# Patient Record
Sex: Male | Born: 1935 | Race: White | Hispanic: No | Marital: Married
Health system: Midwestern US, Community
[De-identification: ages and names within clinical notes are randomized; demographics above are authoritative.]

## PROBLEM LIST (undated history)

## (undated) DIAGNOSIS — Z950 Presence of cardiac pacemaker: Secondary | ICD-10-CM

## (undated) DIAGNOSIS — E785 Hyperlipidemia, unspecified: Secondary | ICD-10-CM

## (undated) DIAGNOSIS — I4891 Unspecified atrial fibrillation: Secondary | ICD-10-CM

## (undated) DIAGNOSIS — I480 Paroxysmal atrial fibrillation: Principal | ICD-10-CM

## (undated) DIAGNOSIS — I498 Other specified cardiac arrhythmias: Secondary | ICD-10-CM

## (undated) DIAGNOSIS — I10 Essential (primary) hypertension: Secondary | ICD-10-CM

## (undated) DIAGNOSIS — I2510 Atherosclerotic heart disease of native coronary artery without angina pectoris: Secondary | ICD-10-CM

## (undated) DIAGNOSIS — E782 Mixed hyperlipidemia: Secondary | ICD-10-CM

## (undated) DIAGNOSIS — I499 Cardiac arrhythmia, unspecified: Secondary | ICD-10-CM

## (undated) DIAGNOSIS — E119 Type 2 diabetes mellitus without complications: Secondary | ICD-10-CM

## (undated) DIAGNOSIS — N289 Disorder of kidney and ureter, unspecified: Secondary | ICD-10-CM

## (undated) DIAGNOSIS — F0390 Unspecified dementia without behavioral disturbance: Secondary | ICD-10-CM

## (undated) DIAGNOSIS — E079 Disorder of thyroid, unspecified: Secondary | ICD-10-CM

## (undated) HISTORY — PX: CATARACT EXTRACTION, BILATERAL: SHX1313

## (undated) HISTORY — PX: PACEMAKER IMPLANT: EP1218

## (undated) HISTORY — PX: OTHER SURGICAL HISTORY: SHX169

---

## 2009-10-13 ENCOUNTER — Telehealth

## 2009-10-13 NOTE — Telephone Encounter (Addendum)
Needs norpace rx lone [facility] pharmacy

## 2009-10-13 NOTE — Telephone Encounter (Signed)
Pt was last seen in the office 01/06/09 By Dr.Lineberry. He has a follow up 01/05/10. Mark Franklin

## 2010-01-05 NOTE — Progress Notes (Signed)
[facility] [place], [place]  [address], [place]  [address]  Phone: [phone]  Fax: [phone]    OFFICE VISIT:  01/05/2010    Mark Franklin - DOB: November 27, 1928    Reason For Visit:  Sergei is a 76 y.o. male who is here for Annual Exam    Subjective  Chancy has no exertional chest pain, pressure, burning or squeezing. He is having no complaints. He sleeps on his right side.  No orthopnea or paroxysmal nocturnal dyspnea.  No symptomatic tachy- or brady-arrhythmia.  No numbness or weakness to suggest cerebrovascular accident or transient ischemic attack.    Harle Battiest has the following history as recorded in EpicCare:    Patient Active Problem List   Diagnoses Date Noted   . CAD (coronary artery disease)    . Hyperlipidemia    . HYPERTENSION    . Diabetes mellitus    . PUD (peptic ulcer disease)    . Polyarticular arthritis      Past Medical History   Diagnosis Date   . CAD (coronary artery disease)    . Hyperlipidemia    . HYPERTENSION    . Diabetes mellitus    . PUD (peptic ulcer disease)    . Polyarticular arthritis      Past Surgical History   Procedure Date   . Cardiac pacemaker placement    . Cataract removal    . Carpal tunnel release    . Finger trigger release    . Cardiac catheterization 528413     Ef 60%   . Cardiac catheterization 020801   . Cardiac catheterization 0801   . Cardiac catheterization 244010     EF 50%     Family History   Problem Relation Age of Onset   . Coronary Art Dis Other      family history     History   Substance Use Topics   . Smoking status: Former Games developer   . Smokeless tobacco: Not on file   . Alcohol Use: Not on file      Current Outpatient Prescriptions   Medication Sig Dispense Refill   . aspirin 325 MG tablet Take 325 mg by mouth daily.         Marland Kitchen atorvastatin (LIPITOR) 80 MG tablet Take 80 mg by mouth daily.         Marland Kitchen amlodipine (NORVASC) 10 MG tablet Take 10 mg by mouth daily.         . valsartan-hydrochlorothiazide (DIOVAN HCT) 160-25 MG per  tablet Take 1 tablet by mouth daily.         . metformin (GLUCOPHAGE) 1000 MG tablet Take 1,000 mg by mouth 2 times daily (with meals).         Marland Kitchen omeprazole (PRILOSEC) 20 MG capsule Take 20 mg by mouth 2 times daily.         . disopyramide (NORPACE CR) 150 MG CR capsule Take 1 capsule by mouth 2 times daily.  60 capsule  3     Allergies: Review of patient's allergies indicates no known allergies.    Objective  Vital Signs - BP 110/60  Pulse 60  Ht 5\' 8"  (1.727 m)  Wt 178 lb (80.74 kg)  BMI 27.06 kg/m2  General - Nace is alert, cooperative, and pleasant.  No acute distress.      HEENT - The head is normocephalic.  Neck - Supple, without increased jugular venous pressures.  No  carotid bruits.    Lungs - Lungs are clear bilaterally.  No wheezes or rales.    Cardiovascular - Heart has regular rate and rhythm.  No murmurs, rubs or gallops.      Abdominal -  Soft, nontender, nondistended.  Bowel sounds are intact.   Extremities - No clubbing, cyanosis, or edema.   Musculoskeletal - No musculoskeletal symptoms.     Neurological - No focal signs are identified. Cranial nerves II through XII are grossly intact.    Assessment:   Stable cardiovascular status.     Plan  1. Pacemaker check today.    2. Return for wellness visit in six months and return to see me in a year.  3. Continue same medications.  4. Call with any problems, questions or concerns.    Judith Blonder. Chase Picket, M.D., Ph.D., F.A.C.C.

## 2010-05-31 LAB — APTT: PTT: 25.7 s (ref 21.2–36.5)

## 2010-05-31 LAB — BASIC METABOLIC PANEL
Anion Gap: 9 MEQ/L (ref 7–19)
BUN: 20 MG/DL (ref 6–20)
CO2: 30 MEQ/L — ABNORMAL HIGH (ref 23–29)
Calcium: 10.8 MG/DL — ABNORMAL HIGH (ref 8.5–10.3)
Chloride: 101 MEQ/L (ref 98–111)
Creatinine: 1.1 MG/DL (ref 0.3–1.3)
Gfr Calculated: 69 mL/min (ref >59)
Glucose: 107 MG/DL
Potassium, Fluid: 4.5 MEQ/L (ref 3.6–5.0)
Sodium: 140 MEQ/L (ref 136–145)

## 2010-05-31 LAB — URINALYSIS
Bilirubin, Urine: NEGATIVE
Glucose, Ur: NEGATIVE MG/DL
Ketones, Urine: NEGATIVE MG/DL
Leukocyte Esterase, Urine: NEGATIVE
Nitrite, Urine: NEGATIVE
Occult Blood,Urine: NEGATIVE
Protein, UA: NEGATIVE MG/DL
Specific Gravity, Urine: 1.02 (ref 1.001–1.035)
Urobilinogen, Urine: 0.2 EU/DL
pH, UA: 6

## 2010-05-31 LAB — CBC WITH AUTO DIFFERENTIAL
Basophils %: 0.6 % (ref 0–1)
Basophils: 0.05 10*3/uL (ref 0.0–0.20)
Eosinophils %: 0.53 10*3/uL (ref 0.0–0.60)
Eosinophils %: 5.8 % — ABNORMAL HIGH (ref 1–5)
Hematocrit: 43.8 % (ref 42–52)
Hemoglobin: 14.4 G/DL (ref 14.0–18.0)
Lymphocytes: 1.7 10*3/uL (ref 1.1–4.5)
Lymphocytes: 18.7 % — ABNORMAL LOW (ref 20–40)
MCH: 30.8 PG (ref 27–31)
MCHC: 32.9 G/DL — ABNORMAL LOW (ref 33–37)
MCV: 93.6 FL (ref 80–94)
MPV: 9.6 FL — ABNORMAL LOW (ref 10.2–13.2)
Monocytes %: 10.1 % — ABNORMAL HIGH (ref 0–10)
Monocytes: 0.92 10*3/uL — ABNORMAL HIGH (ref 0.0–0.9)
Neutrophils %: 64.8 % (ref 50–70)
Neutrophils Absolute: 5.89 10*3/uL (ref 1.5–7.5)
Platelet Count: 254 (ref 130–400)
RBC: 4.68 MIL/uL — ABNORMAL LOW (ref 4.7–6.1)
RDW: 14 % (ref 11.0–14.0)
WBC: 9.09 10*3/uL (ref 4.8–10.8)

## 2010-05-31 LAB — PROTIME-INR
INR: 0.98 (ref 0.90–1.10)
Prothrombin Time: 10.6 s (ref 9.7–12.5)

## 2010-06-01 LAB — CULTURE, MRSA, SCREENING: MRSA Culture/Susceptibility: NOT DETECTED

## 2010-06-07 ENCOUNTER — Telehealth

## 2010-06-07 NOTE — Telephone Encounter (Signed)
Needs or clearance w/ dr. Allena Katz lexi scheduled 06/14/10 at 730 pt notified

## 2010-06-08 NOTE — Progress Notes (Signed)
[facility], [place]  [address], [place]  [address]  Phone: [phone]  Fax: [phone]    OFFICE VISIT:  06/08/2010    Mark Franklin - DOB: Sep 12, 1928    Reason For Visit:  Mark Franklin is a 77 y.o. male who is here for Cardiac Clearance and Coronary Artery Disease      Subjective  Mark Franklin denies exertional chest pain, shortness of breath, orthopnea, paroxysmal nocturnal dyspnea, syncope, presyncope, arrythmia, edema and fatigue.  The patient denies numbness or weakness to suggest cerebrovascular accident or transient ischemic attack.      Mark Franklin has the following history as recorded in EpicCare:    Patient Active Problem List   Diagnoses Date Noted   . Pacemaker    . CAD (coronary artery disease)    . Hyperlipidemia    . HYPERTENSION    . Diabetes mellitus    . PUD (peptic ulcer disease)    . Polyarticular arthritis      Past Medical History   Diagnosis Date   . CAD (coronary artery disease)    . Hyperlipidemia    . Hypertension    . Diabetes mellitus    . PUD (peptic ulcer disease)    . Polyarticular arthritis    . Pacemaker 1/05     Medtronic Enpulse     Past Surgical History   Procedure Date   . Cardiac pacemaker placement    . Cataract removal    . Carpal tunnel release    . Finger trigger release    . Cardiac catheterization 528413     Ef 60%   . Cardiac catheterization 020801   . Cardiac catheterization 0801   . Cardiac catheterization 244010     EF 50%     Family History   Problem Relation Age of Onset   . Coronary Art Dis Other      family history     History   Substance Use Topics   . Smoking status: Former Games developer   . Smokeless tobacco: Not on file   . Alcohol Use: Not on file      Current Outpatient Prescriptions   Medication Sig Dispense Refill   . aspirin 325 MG tablet Take 325 mg by mouth daily.         Marland Kitchen atorvastatin (LIPITOR) 80 MG tablet Take 40 mg by mouth daily.       Marland Kitchen amlodipine (NORVASC) 10 MG tablet Take 10 mg by mouth daily.         .  valsartan-hydrochlorothiazide (DIOVAN HCT) 160-25 MG per tablet Take 1 tablet by mouth daily.         . metformin (GLUCOPHAGE) 1000 MG tablet Take 1,000 mg by mouth 2 times daily (with meals).         Marland Kitchen omeprazole (PRILOSEC) 20 MG capsule Take 20 mg by mouth 2 times daily.         . disopyramide (NORPACE CR) 150 MG CR capsule Take 1 capsule by mouth 2 times daily.  60 capsule  3     Allergies: Review of patient's allergies indicates no known allergies.    Objective  Vital Signs - BP 140/62  Pulse 100  Ht 5\' 8"  (1.727 m)  Wt 178 lb (80.74 kg)  BMI 27.06 kg/m2  General - Mark Franklin is alert, cooperative, and pleasant.  No acute distress.      HEENT - The head is normocephalic.  Neck -  Supple, without increased jugular venous pressures.  No carotid bruits.    Lungs - Lungs are clear bilaterally.  No wheezes or rales.    Cardiovascular - Heart has regular rhythm and rate. No murmurs, rubs or gallops.      Abdominal -  Soft, nontender, nondistended.  Bowel sounds are intact.   Extremities - No clubbing, cyanosis, or edema.   Musculoskeletal - No musculoskeletal symptoms.     Neurological - No focal signs are identified. Cranial nerves II through XII are grossly intact.    Assessment:    1. CAD (coronary artery disease)    2. Hyperlipidemia    3. Hypertension    4. Polyarticular arthritis    5. Pacemaker    Lexiscan already ordered and set up for next week for surgical clearance.   Pacemaker check showed appropriate diagnostics without any sustained arrhythmias.   Stable cardiovascular status. No evidence of overt heart failure, angina or dysrhythmia.     Plan  1. Lexiscan next week  2. Return to see Dr. Chase Picket in 6  Months as scheduled.   3. Continue same medications.  4. Call with any problems, questions or concerns.  5. Continue to see primary care provider for non cardiac concerns and labs.  6. Education materials provided to patient    Octavia Bruckner, APRN

## 2010-06-08 NOTE — Patient Instructions (Addendum)
Mark Franklin will call you on     Chemical Stress Test   (Adenosine Stress Test;  Regadenason Eugenie Birks) Stress Test; Dobutamine Stress Test)     Definition   A stress test is used to make sure the heart muscles are able to get enough blood when the heart rate and workload are increased. To do this, your heart needs to be looked at during a period of rest and then again during a period of increased activity. A chemical stress test uses chemical agents injected into the body through the vein. These chemicals make the heart function as if it were under stress.   There are many different ways to examine the heart during a stress test. The heart can be examined with:   Stress echocardiogram   Nuclear imaging ( nuclear stress test )   Reasons for Test   A chemical stress test is used when a traditional stress test (called a cardiac stress test) cannot be done. A cardiac stress test requires you to walk on a treadmill or ride a stationary bicycle until your heart rate reaches a level where your heart is "stressed." You may not be able to participate in this test if you have certain conditions, such as:   Chronic back pain   Arthritis   Stroke   In this case, a chemical stress test is used. This test is often used to help your doctor:   Determine if you have a heart condition causing your chest pain   Determine if arteries to the heart have blockages or narrowing ( coronary artery disease [CAD])   Identify an irregular heart rhythm   Monitor the heart's response to treatment or procedures   Plan rehabilitation after a heart attack   Possible Complications   Complications are rare. If you are planning to have this test, your doctor will review a list of possible complications, which may include:   Shortness of breath   Chest pain   Irregular heartbeat   Heart attack (rare)   Technicians will be checking for signs of heart or lung problems. They will be prepared to take action right away if complications develop. A cardiologist  (heart specialist) will also be available during the test.   What to Expect   Prior to Test     Talk with your doctor before the day of the test to discuss how long you should fast (not eat or drink). Your doctor may recommend that you:   Do not eat or drink anything with caffeine 24 hours before the test.   Do not eat or drink anything, except water, 8 hours before the test.   Do not smoke for several hours before the test.   Wear comfortable clothing.   Bring a list of your current medicines to the test. Please hold and beta blockers (Toprol, metoprolol, lopressor, acebutolol, Sectral, propranolol, coreg, carvedilol) or digoxin the morning of your test.  If you have diabetes, bring your glucose monitor to the test.   Tell your doctor if you have asthma or chronic obstructive pulmonary disease (COPD).   Description of Test   You will lie down on a table. A technician will place electrodes on your chest. Your resting blood pressure and ECG readings will be taken. An IV (hollow needle and thin tube) will be placed in your arm. You will be hooked up to a heart monitor that will record your heart's activity. Your blood pressure and heart rate will be checked often. A small  amount of chemical will be injected through the IV and into your body. Depending on which chemical is used, your heart will beat faster and/or the blood vessels near your heart will open wider. An ECG may also be done at this time.   If you have chest pain, trouble breathing, dizziness, or any other symptoms, tell your doctor or technician right away. The test may need to be stopped. Changes in the ECG may also be a reason to stop the test.   If you are getting nuclear imaging, the technician will inject a mildly radioactive chemical through your IV. Thirty to sixty minutes after the chemical injection, a special camera or an MRI will be used to track the flow of the chemical through and around your heart. Images will be taken to find areas of the  heart that may not be getting enough blood or are blocked. If you are getting a stress echocardiogram, an ultrasound will be taken at specific time points. The doctor will compare the pictures of your heart under stress with pictures of your heart at rest.   After Test   Your blood pressure, heart rate, and ECG will be monitored until levels return to normal. You will be able to leave after the test is done.   How Long Will It Take?   Typically takes 3-4 hours to complete (may be done over 1-2 days)   Will It Hurt?   No, you should not feel pain during the test. You may feel a pinch when the IV is inserted. You may also feel a flushing sensation when the medicine is injected.   Results   Your doctor may discuss some of the results on the same day as the test. It may take 2-3 days for the full results to be ready.   One or more of the following are considered a positive stress test:   ECG changes that show low oxygen supply to the heart   Significant blood pressure drops or rhythm changes   You have chest pain or trouble breathing, especially if linked with ECG changes   Stress test pictures that show areas of your heart having low blood flow or abnormal movements   A positive test may mean that you have CAD. Not everyone tests positive for it. Based on your results, your doctor may recommend more tests or care.   Call Your Doctor   After the test, call your doctor if any of the following occurs:   Chest pain   Shortness of breath   Racing or irregular heart beat   Dizziness or weakness   Any other unusual symptoms or concerns   If you think you have an emergency, CALL 911 .     Please be at [facility] on ____Tues 2-28th_________________      at _______7:30AM______________________ for your test.

## 2010-06-10 NOTE — Telephone Encounter (Signed)
Mark Franklin called the call center and stated she had some questions to ask you regarding her husband's upcoming surgery. She stated her husband was argumentative and and she just wanted to talk to you. The call center offered for her to talk to nurse but she refused stating she just wanted to talk to you.

## 2010-06-14 NOTE — Telephone Encounter (Signed)
Spoke with Mrs Crafts- he had test this morning.  Will call with results.

## 2010-06-22 ENCOUNTER — Inpatient Hospital Stay
Admit: 2010-06-22 | Disposition: A | Discharge status: Skilled Nursing Facility | Attending: Orthopaedic Surgery | Admitting: Orthopaedic Surgery

## 2010-06-22 LAB — TYPE AND SCREEN: ABO Check: O POS

## 2010-06-22 LAB — GLUCOSE TOLERANCE TEST
Glucose Accuchek: 105 MG/DL
Glucose Accuchek: 160 MG/DL
Glucose Accuchek: 161 MG/DL

## 2010-06-23 LAB — BASIC METABOLIC PANEL
Anion Gap: 9 MEQ/L (ref 7–19)
BUN: 11 MG/DL (ref 6–20)
CO2: 29 MEQ/L (ref 23–29)
Calcium: 9 MG/DL (ref 8.5–10.3)
Chloride: 90 MEQ/L — ABNORMAL LOW (ref 98–111)
Creatinine: 1 MG/DL (ref 0.3–1.3)
Gfr Calculated: 77 mL/min (ref >59)
Glucose: 152 MG/DL
Potassium: 4.3 mEq/L (ref 3.5–5.0)
Sodium: 128 MEQ/L — ABNORMAL LOW (ref 136–145)

## 2010-06-23 LAB — CBC PANEL ITEM
Basophils %: 0.2 % (ref 0–1)
Basophils: 0.02 10*3/uL (ref 0.0–0.20)
Eosinophils %: 0.07 10*3/uL (ref 0.0–0.60)
Eosinophils %: 0.6 % — ABNORMAL LOW (ref 1–5)
Hematocrit: 33.6 % — ABNORMAL LOW (ref 42–52)
Hgb: 11.3 G/DL — ABNORMAL LOW (ref 14.0–18.0)
Lymphocytes: 1.58 10*3/uL (ref 1.1–4.5)
Lymphocytes: 12.9 % — ABNORMAL LOW (ref 20–40)
MCH: 30.3 PG (ref 27–31)
MCHC: 33.6 G/DL (ref 33–37)
MCV: 90.1 FL (ref 80–94)
MPV: 9.2 FL — ABNORMAL LOW (ref 10.2–13.2)
Monocytes %: 14.9 % — ABNORMAL HIGH (ref 0–10)
Monocytes: 1.82 10*3/uL — ABNORMAL HIGH (ref 0.0–0.9)
Neutrophils %: 71.4 % — ABNORMAL HIGH (ref 50–70)
Neutrophils Absolute: 8.74 10*3/uL — ABNORMAL HIGH (ref 1.5–7.5)
Platelet Count: 233 (ref 130–400)
RBC: 3.73 MIL/uL — ABNORMAL LOW (ref 4.7–6.1)
RDW: 13.6 % (ref 11.0–14.0)
WBC: 12.23 10*3/uL — ABNORMAL HIGH (ref 4.8–10.8)

## 2010-06-23 LAB — GLUCOSE TOLERANCE TEST
Glucose Accuchek: 178 MG/DL
Glucose Accuchek: 137 MG/DL
Glucose Accuchek: 168 MG/DL

## 2010-06-23 LAB — HEMOGLOBIN A1C: %HBA1C: 5.6 % (ref 4.6–6.2)

## 2010-06-24 LAB — CBC PANEL ITEM
Basophils %: 0.2 % (ref 0–1)
Basophils: 0.03 10*3/uL (ref 0.0–0.20)
Eosinophils %: 0.01 10*3/uL (ref 0.0–0.60)
Eosinophils %: 0.1 % — ABNORMAL LOW (ref 1–5)
Hematocrit: 31.1 % — ABNORMAL LOW (ref 42–52)
Hgb: 10.6 G/DL — ABNORMAL LOW (ref 14.0–18.0)
Lymphocytes: 0.87 10*3/uL — ABNORMAL LOW (ref 1.1–4.5)
Lymphocytes: 7.2 % — ABNORMAL LOW (ref 20–40)
MCH: 29.9 PG (ref 27–31)
MCHC: 34.1 G/DL (ref 33–37)
MCV: 87.9 FL (ref 80–94)
MPV: 9.2 FL — ABNORMAL LOW (ref 10.2–13.2)
Monocytes %: 16.3 % — ABNORMAL HIGH (ref 0–10)
Monocytes: 1.98 10*3/uL — ABNORMAL HIGH (ref 0.0–0.9)
Neutrophils %: 76.2 % — ABNORMAL HIGH (ref 50–70)
Neutrophils Absolute: 9.27 10*3/uL — ABNORMAL HIGH (ref 1.5–7.5)
Platelet Count: 190 (ref 130–400)
RBC: 3.54 MIL/uL — ABNORMAL LOW (ref 4.7–6.1)
RDW: 13.1 % (ref 11.0–14.0)
WBC: 12.16 10*3/uL — ABNORMAL HIGH (ref 4.8–10.8)

## 2010-06-24 LAB — BASIC METABOLIC PANEL
Anion Gap: 8 MEQ/L (ref 7–19)
BUN: 9 MG/DL (ref 6–20)
CO2: 28 MEQ/L (ref 23–29)
Calcium: 8.9 MG/DL (ref 8.5–10.3)
Chloride: 91 MEQ/L — ABNORMAL LOW (ref 98–111)
Creatinine: 0.9 MG/DL (ref 0.3–1.3)
Gfr Calculated: 87 mL/min (ref >59)
Glucose: 146 MG/DL
Potassium: 3.8 mEq/L (ref 3.5–5.0)
Sodium: 127 MEQ/L — ABNORMAL LOW (ref 136–145)

## 2010-06-24 LAB — GLUCOSE TOLERANCE TEST
Glucose Accuchek: 168 MG/DL
Glucose Accuchek: 162 MG/DL
Glucose Accuchek: 164 MG/DL
Glucose Accuchek: 256 MG/DL

## 2010-06-25 LAB — BASIC METABOLIC PANEL
Anion Gap: 8 MEQ/L (ref 7–19)
BUN: 10 MG/DL (ref 6–20)
CO2: 32 MEQ/L — ABNORMAL HIGH (ref 23–29)
Calcium: 9.3 MG/DL (ref 8.5–10.3)
Chloride: 87 MEQ/L — ABNORMAL LOW (ref 98–111)
Creatinine: 0.9 MG/DL (ref 0.3–1.3)
Gfr Calculated: 87 mL/min (ref >59)
Glucose: 140 MG/DL
Potassium: 3.7 mEq/L (ref 3.5–5.0)
Sodium: 127 MEQ/L — ABNORMAL LOW (ref 136–145)

## 2010-06-25 LAB — HEMOGLOBIN AND HEMATOCRIT
Hematocrit: 31.3 % — ABNORMAL LOW (ref 42–52)
Hemoglobin: 10.8 G/DL — ABNORMAL LOW (ref 14.0–18.0)

## 2010-06-25 LAB — GLUCOSE TOLERANCE TEST
Glucose Accuchek: 159 MG/DL
Glucose Accuchek: 190 MG/DL
Glucose Accuchek: 205 MG/DL

## 2010-06-26 LAB — BASIC METABOLIC PANEL
Anion Gap: 5 MEQ/L — ABNORMAL LOW (ref 7–19)
BUN: 10 MG/DL (ref 6–20)
CO2: 33 MEQ/L — ABNORMAL HIGH (ref 23–29)
Calcium: 9 MG/DL (ref 8.5–10.3)
Chloride: 89 MEQ/L — ABNORMAL LOW (ref 98–111)
Creatinine: 0.9 MG/DL (ref 0.3–1.3)
Gfr Calculated: 87 mL/min (ref >59)
Glucose: 138 MG/DL
Potassium: 3 mEq/L — ABNORMAL LOW (ref 3.5–5.0)
Sodium: 127 MEQ/L — ABNORMAL LOW (ref 136–145)

## 2010-06-26 LAB — GLUCOSE TOLERANCE TEST
Glucose Accuchek: 171 MG/DL
Glucose Accuchek: 139 MG/DL
Glucose Accuchek: 139 MG/DL
Glucose Accuchek: 198 MG/DL

## 2010-06-27 ENCOUNTER — Inpatient Hospital Stay: Admit: 2010-06-27 | Disposition: A | Discharge status: Home Health | Admitting: Internal Medicine

## 2010-06-27 LAB — BASIC METABOLIC PANEL
Anion Gap: 9 MEQ/L (ref 7–19)
BUN: 11 MG/DL (ref 6–20)
CO2: 31 MEQ/L — ABNORMAL HIGH (ref 23–29)
Calcium: 9.1 MG/DL (ref 8.5–10.3)
Chloride: 91 MEQ/L — ABNORMAL LOW (ref 98–111)
Creatinine: 1 MG/DL (ref 0.3–1.3)
Gfr Calculated: 77 mL/min (ref >59)
Glucose: 142 MG/DL
Potassium: 3.4 mEq/L — ABNORMAL LOW (ref 3.5–5.0)
Sodium: 131 MEQ/L — ABNORMAL LOW (ref 136–145)

## 2010-06-27 LAB — GLUCOSE TOLERANCE TEST
Glucose Accuchek: 155 MG/DL
Glucose Accuchek: 248 MG/DL
Glucose Accuchek: 142 MG/DL

## 2010-06-28 LAB — BASIC METABOLIC PANEL
Anion Gap: 7 MEQ/L (ref 7–19)
BUN: 15 MG/DL (ref 6–20)
CO2: 31 MEQ/L — ABNORMAL HIGH (ref 23–29)
Calcium: 9.1 MG/DL (ref 8.5–10.3)
Chloride: 92 MEQ/L — ABNORMAL LOW (ref 98–111)
Creatinine: 1.1 MG/DL (ref 0.3–1.3)
Gfr Calculated: 69 mL/min (ref >59)
Glucose: 159 MG/DL
Potassium: 3.5 mEq/L (ref 3.5–5.0)
Sodium: 130 MEQ/L — ABNORMAL LOW (ref 136–145)

## 2010-06-28 LAB — PREALBUMIN: Prealbumin: 7.4 MG/DL — ABNORMAL LOW (ref 17–40)

## 2010-06-28 LAB — GLUCOSE TOLERANCE TEST
Glucose Accuchek: 204 MG/DL
Glucose Accuchek: 145 MG/DL
Glucose Accuchek: 146 MG/DL
Glucose Accuchek: 153 MG/DL

## 2010-06-29 LAB — BASIC METABOLIC PANEL
Anion Gap: 9 MEQ/L (ref 7–19)
BUN: 15 MG/DL (ref 6–20)
CO2: 30 MEQ/L — ABNORMAL HIGH (ref 23–29)
Calcium: 9 MG/DL (ref 8.5–10.3)
Chloride: 93 MEQ/L — ABNORMAL LOW (ref 98–111)
Creatinine: 1.1 MG/DL (ref 0.3–1.3)
Gfr Calculated: 69 mL/min (ref >59)
Glucose: 131 MG/DL
Potassium: 3.1 mEq/L — ABNORMAL LOW (ref 3.5–5.0)
Sodium: 132 MEQ/L — ABNORMAL LOW (ref 136–145)

## 2010-06-29 LAB — GLUCOSE TOLERANCE TEST
Glucose Accuchek: 182 MG/DL
Glucose Accuchek: 131 MG/DL
Glucose Accuchek: 158 MG/DL

## 2010-07-07 LAB — BASIC METABOLIC PANEL
Anion Gap: 10 MEQ/L (ref 7–19)
BUN: 21 MG/DL — ABNORMAL HIGH (ref 6–20)
CO2: 28 MEQ/L (ref 23–29)
Calcium: 10.3 MG/DL (ref 8.5–10.3)
Chloride: 95 MEQ/L — ABNORMAL LOW (ref 98–111)
Creatinine: 1.3 MG/DL (ref 0.3–1.3)
Gfr Calculated: 57 mL/min — ABNORMAL LOW (ref >59)
Glucose: 86 MG/DL
Potassium: 5.1 mEq/L — ABNORMAL HIGH (ref 3.5–5.0)
Sodium: 132 MEQ/L — ABNORMAL LOW (ref 136–145)

## 2010-08-11 ENCOUNTER — Encounter

## 2010-08-11 MED ORDER — DISOPYRAMIDE PHOSPHATE ER 150 MG PO CP12
150 MG | ORAL_CAPSULE | Freq: Two times a day (BID) | ORAL | Status: DC
Start: 2010-08-11 — End: 2011-08-16

## 2010-08-30 ENCOUNTER — Encounter

## 2010-08-30 MED ORDER — AMLODIPINE BESYLATE 10 MG PO TABS
10 MG | ORAL_TABLET | Freq: Every day | ORAL | Status: DC
Start: 2010-08-30 — End: 2011-09-21

## 2010-10-18 ENCOUNTER — Encounter

## 2010-10-18 MED ORDER — LIPITOR 80 MG PO TABS
80 MG | ORAL_TABLET | ORAL | Status: DC
Start: 2010-10-18 — End: 2011-11-16

## 2010-10-18 NOTE — Telephone Encounter (Signed)
Lipids done at Dr. Blanchie Serve, faxing over results

## 2011-01-11 NOTE — Patient Instructions (Signed)
Increase your exercise capacity.   Continue your current medications.   Return to see me in six months.      Judith Blonder. Chase Picket, M.D., Ph.D., F.A.C.C. - Cardiologist

## 2011-01-11 NOTE — Progress Notes (Addendum)
[facility]  [address], [address], [place]  [address]  Phone: [phone]  Fax: [phone]  Office Visit:  01/11/2011    Mark Franklin  Male, 77 y.o., DOB: October 26, 1928     Subjective:   Mark Franklin presents today without a change in his anginal pattern.  No overt heart failure, no syncope and no stroke-like problems.  He has developed considerable gas.    Patient Active Problem List   Diagnoses Date Noted   . Pacemaker    . CAD (coronary artery disease)    . Hyperlipidemia    . HYPERTENSION    . Diabetes mellitus    . PUD (peptic ulcer disease)    . Polyarticular arthritis        Allergies: Review of patient's allergies indicates no known allergies.    Outpatient Prescriptions Marked as Taking for the 01/11/11 encounter (Office Visit) with Mark Clark, MD   Medication Sig Dispense Refill   . losartan (COZAAR) 50 MG tablet Take 50 mg by mouth daily.         . tamsulosin (FLOMAX) 0.4 MG capsule Take 0.4 mg by mouth daily.         . hydrochlorothiazide (HYDRODIURIL) 25 MG tablet Take 25 mg by mouth daily.         Marland Kitchen levothyroxine (SYNTHROID) 25 MCG tablet Take 25 mcg by mouth Daily.         Marland Kitchen LIPITOR 80 MG tablet TAKE 1 TABLET BY MOUTH ONCE DAILY  90 tablet  1   . amlodipine (NORVASC) 10 MG tablet Take 1 tablet by mouth daily.  90 tablet  3   . disopyramide (NORPACE CR) 150 MG CR capsule Take 1 capsule by mouth 2 times daily.  60 capsule  5   . aspirin 325 MG tablet Take 325 mg by mouth daily.         . metformin (GLUCOPHAGE) 1000 MG tablet Take 1,000 mg by mouth daily (with breakfast).       Marland Kitchen omeprazole (PRILOSEC) 20 MG capsule Take 20 mg by mouth 2 times daily.             Past Medical History   Diagnosis Date   . CAD (coronary artery disease)    . Hyperlipidemia    . Hypertension    . Diabetes mellitus    . PUD (peptic ulcer disease)    . Polyarticular arthritis    . Pacemaker 1/05     Medtronic Enpulse       Past Surgical History   Procedure Date   .  Cardiac pacemaker placement    . Cataract removal    . Carpal tunnel release    . Finger trigger release    . Cardiac catheterization 130865     Ef 60%   . Cardiac catheterization 020801   . Cardiac catheterization 0801   . Cardiac catheterization 784696     EF 50%       Family History   Problem Relation Age of Onset   . Coronary Art Dis Other      family history       History   Substance Use Topics   . Smoking status: Former Games developer   . Smokeless tobacco: Not on file   . Alcohol Use: Not on file       Objective:   BP 130/58  Pulse 72  Ht 5\' 8"  (1.727 m)  Wt 188 lb (85.276 kg)  BMI 28.59 kg/m2  GENERAL:  The patient is a pleasant 77 y.o. male in no apparent distress.   HEENT:  The head is normocephalic.  NECK:  Supple without increased jugular venous pressures.  No carotid bruits heard.     LUNGS:  The lungs are clear.      HEART:  Regular rhythm without appreciable murmur or rub.   ABDOMEN:  Soft, nontender, nondistended.  Bowel sounds are intact.   EXTREMITIES:  There is no clubbing, cyanosis, or edema.   NEUROLOGICAL:  Cranial nerves II through XII are grossly intact.    Pacemaker interrogation today.   Estimated remaining longevity: 8 months.    Assessment:   Stable cardiovascular status.     Plan:   1.  Continue same medications.   2.  We will see him back in six months.  It is anticipated that we will need to switch out his pacemaker generator prior to that however.    3.  Call with any problems, questions or concerns.    Mark Franklin. Mark Franklin, M.D., Ph.D., F.A.C.C.  [facility] [place], [place]    Cc:   Mark Franklin  [address]  [place] [address]  [phone] office  [phone] fax

## 2011-01-11 NOTE — Progress Notes (Signed)
Pacemaker interrogation today shows adequate pacing and sensing, no arrhythmias, and approximately 8 months remaining battery longevity.  We will start monthly phone checks.  His underlying rhythm is 2:1 heart block.

## 2011-07-12 NOTE — Patient Instructions (Addendum)
Take an extra HCTZ with swelling of the ankles.  Continue your current medications.  Return in six months for wellness visit and in one year to see me.    If you should have any cardiac questions or concerns prior to your next appointment, please call our office.    Judith Blonder. Chase Picket, M.D., Ph.D., F.A.C.C.

## 2011-07-12 NOTE — Progress Notes (Signed)
Pacemaker interrogation with programming today shows less than one month until device reaches elective replacement interval.  No arrhythmias stored.

## 2011-07-14 NOTE — Progress Notes (Signed)
[facility] [place] [place].  [facility]  [address], [address], [place]  [address]  Phone: [phone]  Fax: [phone]  Office Visit:  07/12/2011    Mark Franklin  Male, 78 y.o., DOB: [birth year]     Subjective:   Mark Franklin presents today doing very well.  He is having no angina, no overt heart failure, no syncope, and no stroke-like problems.      Mark Franklin has the following history as recorded in EpicCare:  Patient Active Problem List    Diagnosis Date Noted   . Pacemaker    . CAD (coronary artery disease)    . Hyperlipidemia    . HYPERTENSION    . Diabetes mellitus    . PUD (peptic ulcer disease)    . Polyarticular arthritis      Past Medical History   Diagnosis Date   . CAD (coronary artery disease)    . Hyperlipidemia    . Hypertension    . Diabetes mellitus    . PUD (peptic ulcer disease)    . Polyarticular arthritis    . Pacemaker 1/05     Medtronic Enpulse     Past Surgical History   Procedure Laterality Date   . Cardiac pacemaker placement     . Cataract removal     . Carpal tunnel release     . Finger trigger release     . Cardiac catheterization  130865     Ef 60%   . Cardiac catheterization  020801   . Cardiac catheterization  0801   . Cardiac catheterization  784696     EF 50%     Family History   Problem Relation Age of Onset   . Coronary Art Dis Other      family history     History   Substance Use Topics   . Smoking status: Former Games developer   . Smokeless tobacco: Not on file   . Alcohol Use: Not on file     Allergies: Review of patient's allergies indicates no known allergies.    Outpatient Prescriptions Marked as Taking for the 07/12/11 encounter (Office Visit) with Krystal Clark, MD   Medication Sig Dispense Refill   . Ascorbic Acid (VITAMIN C) 250 MG tablet Take 250 mg by mouth daily.       Marland Kitchen ibuprofen (ADVIL;MOTRIN) 200 MG tablet Take 200 mg by mouth every 6 hours as needed.       Marland Kitchen losartan (COZAAR) 50 MG tablet Take 50 mg by mouth daily.          . tamsulosin (FLOMAX) 0.4 MG capsule Take 0.4 mg by mouth daily.         . hydrochlorothiazide (HYDRODIURIL) 25 MG tablet Take 25 mg by mouth daily.         Marland Kitchen LIPITOR 80 MG tablet TAKE 1 TABLET BY MOUTH ONCE DAILY  90 tablet  1   . amlodipine (NORVASC) 10 MG tablet Take 1 tablet by mouth daily.  90 tablet  3   . disopyramide (NORPACE CR) 150 MG CR capsule Take 1 capsule by mouth 2 times daily.  60 capsule  5   . aspirin 325 MG tablet Take 325 mg by mouth daily.         . metformin (GLUCOPHAGE) 1000 MG tablet Take 500 mg by mouth daily (with breakfast).       Marland Kitchen omeprazole (PRILOSEC) 20 MG capsule Take 20  mg by mouth 2 times daily.           Objective:   BP 132/72  Pulse 80  Ht 5\' 8"  (1.727 m)  Wt 197 lb (89.359 kg)  BMI 29.96 kg/m2    GENERAL:  The patient is a pleasant male in no apparent distress.   HEENT:  The head is normocephalic.  NECK:  Supple without increased jugular venous pressures.       LUNGS:  The lungs are clear.      HEART:  Rhythm is regular without appreciable murmur or rub.   ABDOMEN:  Soft, nontender, nondistended.  Bowel sounds are intact.   EXTREMITIES:  There is no clubbing, cyanosis, or edema.   NEUROLOGICAL:  Cranial nerves II through XII are grossly intact.      Assessment:   1.  Stable cardiovascular status.     Plan:   1.  Continue same medications.   2.  Return for wellness visit in six months and see me in a year.  3.  Call with any problems, questions or concerns.    Judith Blonder. Chase Picket, M.D., Ph.D., F.A.C.C.  [facility] [place], [place].     Cc:   Verlin Dike  [address]  [place] [address]  [phone] office  [phone] fax

## 2011-07-26 LAB — CBC PANEL ITEM
Basophils %: 0.3 % (ref 0–1)
Basophils: 0.03 10*3/uL (ref 0.0–0.20)
Eosinophils %: 0.15 10*3/uL (ref 0.0–0.60)
Eosinophils %: 1.4 % (ref 1–5)
Hematocrit: 45 % (ref 42–52)
Hemoglobin: 16.2 G/DL (ref 14.0–18.0)
Lymphocytes: 1.61 10*3/uL (ref 1.1–4.5)
Lymphocytes: 14.8 % — ABNORMAL LOW (ref 20–40)
MCH: 31.3 PG — ABNORMAL HIGH (ref 27–31)
MCHC: 36 G/DL (ref 33–37)
MCV: 86.9 FL (ref 80–94)
MPV: 9.1 FL — ABNORMAL LOW (ref 10.2–13.2)
Monocytes %: 12.3 % — ABNORMAL HIGH (ref 0–10)
Monocytes: 1.34 10*3/uL — ABNORMAL HIGH (ref 0.0–0.9)
Neutrophils %: 71.2 % — ABNORMAL HIGH (ref 50–70)
Neutrophils Absolute: 7.75 10*3/uL — ABNORMAL HIGH (ref 1.5–7.5)
Platelets: 257 (ref 130–400)
RBC: 5.18 MIL/uL (ref 4.7–6.1)
RDW: 13.3 % (ref 11.0–14.0)
WBC: 10.88 10*3/uL — ABNORMAL HIGH (ref 4.8–10.8)

## 2011-07-26 LAB — COMPREHENSIVE PANEL
ALT: 42 IU/L — ABNORMAL HIGH (ref 10–40)
AST: 33 IU/L (ref 8–41)
Albumin: 4.6 G/DL (ref 3.4–4.8)
Alkaline Phosphatase: 100 IU/L (ref 18–125)
Anion Gap: 10 MEQ/L (ref 7–19)
BUN: 20 MG/DL (ref 6–20)
CO2: 25 MEQ/L (ref 23–29)
Calcium: 10.3 MG/DL (ref 8.5–10.3)
Chloride: 100 MEQ/L (ref 98–111)
Creatinine: 1.3 MG/DL (ref 0.3–1.3)
Gfr Calculated: 56 mL/min — ABNORMAL LOW (ref >59)
Glucose: 130 MG/DL
Potassium: 3.2 mEq/L — ABNORMAL LOW (ref 3.5–5.0)
Sodium: 135 MEQ/L — ABNORMAL LOW (ref 136–145)
Total Bilirubin: 1 MG/DL (ref 0.2–1.2)
Total Protein: 7.1 G/DL (ref 6.4–8.3)

## 2011-07-26 LAB — URINALYSIS
Bilirubin Urine: NEGATIVE
Glucose, Ur: NEGATIVE MG/DL
Ketones, Urine: NEGATIVE MG/DL
Leukocyte Esterase, Urine: NEGATIVE
Nitrite, Urine: NEGATIVE
Occult Blood,Urine: NEGATIVE
Protein, UA: NEGATIVE MG/DL
Specific Gravity, Urine: 1.01 (ref 1.001–1.035)
Urobilinogen, Urine: 0.2 EU/DL
pH, UA: 7.5

## 2011-07-26 LAB — C-REACTIVE PROTEIN: CRP: 0.03 MG/DL (ref 0–0.5)

## 2011-07-26 LAB — POCT TROPONIN: POC Troponin I: 0.01 NG/ML (ref 0.000–0.08)

## 2011-07-27 LAB — GLUCOSE TOLERANCE TEST
Glucose Accuchek: 130 MG/DL
Glucose Accuchek: 134 MG/DL
Glucose Accuchek: 153 MG/DL

## 2011-07-28 ENCOUNTER — Inpatient Hospital Stay
Admit: 2011-07-28 | Disposition: A | Discharge status: Home / Self Care | Attending: Emergency Medicine | Admitting: Emergency Medicine

## 2011-07-28 LAB — CBC PANEL ITEM
Basophils %: 0.3 % (ref 0–1)
Basophils: 0.03 10*3/uL (ref 0.0–0.20)
Eosinophils %: 0.14 10*3/uL (ref 0.0–0.60)
Eosinophils %: 1.3 % (ref 1–5)
Hematocrit: 40.5 % — ABNORMAL LOW (ref 42–52)
Hemoglobin: 14.6 G/DL (ref 14.0–18.0)
Lymphocytes: 1.03 10*3/uL — ABNORMAL LOW (ref 1.1–4.5)
Lymphocytes: 9.9 % — ABNORMAL LOW (ref 20–40)
MCH: 31.1 PG — ABNORMAL HIGH (ref 27–31)
MCHC: 36 G/DL (ref 33–37)
MCV: 86.4 FL (ref 80–94)
MPV: 9.3 FL — ABNORMAL LOW (ref 10.2–13.2)
Monocytes %: 9.3 % (ref 0–10)
Monocytes: 0.97 10*3/uL — ABNORMAL HIGH (ref 0.0–0.9)
Neutrophils %: 79.2 % — ABNORMAL HIGH (ref 50–70)
Neutrophils Absolute: 8.22 10*3/uL — ABNORMAL HIGH (ref 1.5–7.5)
Platelets: 241 (ref 130–400)
RBC: 4.69 MIL/uL — ABNORMAL LOW (ref 4.7–6.1)
RDW: 13.2 % (ref 11.0–14.0)
WBC: 10.39 10*3/uL (ref 4.8–10.8)

## 2011-07-28 LAB — BASIC METABOLIC PANEL
Anion Gap: 6 MEQ/L — ABNORMAL LOW (ref 7–19)
BUN: 20 MG/DL (ref 6–20)
CO2: 28 MEQ/L (ref 23–29)
Calcium: 9.3 MG/DL (ref 8.5–10.3)
Chloride: 96 MEQ/L — ABNORMAL LOW (ref 98–111)
Creatinine: 1 MG/DL (ref 0.3–1.3)
Gfr Calculated: >60 mL/min (ref >59)
Glucose: 131 MG/DL
Potassium: 3.6 mEq/L (ref 3.5–5.0)
Sodium: 130 MEQ/L — ABNORMAL LOW (ref 136–145)

## 2011-07-28 LAB — GLUCOSE TOLERANCE TEST
Glucose Accuchek: 128 MG/DL
Glucose Accuchek: 123 MG/DL
Glucose Accuchek: 138 MG/DL

## 2011-07-29 LAB — BASIC METABOLIC PANEL
Anion Gap: 8 MEQ/L (ref 7–19)
BUN: 21 MG/DL — ABNORMAL HIGH (ref 6–20)
CO2: 28 MEQ/L (ref 23–29)
Calcium: 9.8 MG/DL (ref 8.5–10.3)
Chloride: 93 MEQ/L — ABNORMAL LOW (ref 98–111)
Creatinine: 1 MG/DL (ref 0.3–1.3)
Gfr Calculated: 76 mL/min (ref 59–300)
Glucose: 174 MG/DL
Potassium: 4.2 mEq/L (ref 3.5–5.0)
Sodium: 129 MEQ/L — ABNORMAL LOW (ref 136–145)

## 2011-07-29 LAB — GLUCOSE TOLERANCE TEST
Glucose Accuchek: 147 MG/DL
Glucose Accuchek: 195 MG/DL

## 2011-07-30 LAB — GLUCOSE TOLERANCE TEST
Glucose Accuchek: 191 MG/DL
Glucose Accuchek: 189 MG/DL

## 2011-08-06 ENCOUNTER — Inpatient Hospital Stay
Admit: 2011-08-06 | Disposition: A | Discharge status: Home / Self Care | Attending: Emergency Medicine | Admitting: Emergency Medicine

## 2011-08-06 LAB — CBC
Hematocrit: 50.1 % (ref 42–52)
Hemoglobin: 18.1 G/DL — ABNORMAL HIGH (ref 14.0–18.0)
MCH: 31 PG (ref 27–31)
MCHC: 36.1 G/DL (ref 33–37)
MCV: 85.8 FL (ref 80–94)
MPV: 9.4 FL — ABNORMAL LOW (ref 10.2–13.2)
Platelet Count Manual: 337 10*3/uL (ref 130–400)
RBC: 5.84 10*6/uL (ref 4.7–6.1)
RDW: 13.4 % (ref 11.0–14.0)
WBC: 18.19 10*3/uL — ABNORMAL HIGH (ref 4.8–10.8)

## 2011-08-06 LAB — URINALYSIS
Bilirubin Urine: NEGATIVE
Glucose, Ur: NEGATIVE MG/DL
Leukocyte Esterase, Urine: NEGATIVE
Nitrite, Urine: NEGATIVE
Occult Blood,Urine: NEGATIVE
Protein, UA: NEGATIVE MG/DL
Specific Gravity, Urine: 1.015 (ref 1.001–1.035)
Urobilinogen, Urine: 0.2 EU/DL
pH, UA: 8

## 2011-08-06 LAB — GLUCOSE TOLERANCE TEST: Glucose Accuchek: 167 MG/DL

## 2011-08-06 LAB — BASIC METABOLIC PANEL
Anion Gap: 17 MEQ/L (ref 7–19)
BUN: 19 MG/DL (ref 6–20)
CO2: 23 MEQ/L (ref 23–29)
Calcium: 10.4 MG/DL — ABNORMAL HIGH (ref 8.5–10.3)
Chloride: 91 MEQ/L — ABNORMAL LOW (ref 98–111)
Creatinine: 1.2 MG/DL (ref 0.3–1.3)
Gfr Calculated: 62 mL/min (ref 59–300)
Glucose: 131 MG/DL
Potassium: 3.9 mEq/L (ref 3.5–5.0)
Sodium: 131 MEQ/L — ABNORMAL LOW (ref 136–145)

## 2011-08-07 LAB — GLUCOSE TOLERANCE TEST
Glucose Accuchek: 151 MG/DL
Glucose Accuchek: 163 MG/DL

## 2011-08-08 LAB — GLUCOSE TOLERANCE TEST
Glucose Accuchek: 152 MG/DL
Glucose Accuchek: 145 MG/DL
Glucose Accuchek: 161 MG/DL

## 2011-08-09 LAB — GLUCOSE TOLERANCE TEST
Glucose Accuchek: 144 MG/DL
Glucose Accuchek: 139 MG/DL

## 2011-08-09 LAB — CBC PANEL ITEM
Basophils %: 0.3 % (ref 0–1)
Basophils: 0.03 10*3/uL (ref 0.0–0.20)
Eosinophils %: 0.25 10*3/uL (ref 0.0–0.60)
Eosinophils %: 2.3 % (ref 1–5)
Hematocrit: 44.9 % (ref 42–52)
Hemoglobin: 15.9 G/DL (ref 14.0–18.0)
Lymphocytes: 1.73 10*3/uL (ref 1.1–4.5)
Lymphocytes: 15.7 % — ABNORMAL LOW (ref 20–40)
MCH: 30.8 PG (ref 27–31)
MCHC: 35.4 G/DL (ref 33–37)
MCV: 87 FL (ref 80–94)
MPV: 8.7 FL — ABNORMAL LOW (ref 10.2–13.2)
Monocytes %: 12.5 % — ABNORMAL HIGH (ref 0–10)
Monocytes: 1.38 10*3/uL — ABNORMAL HIGH (ref 0.0–0.9)
Neutrophils %: 69.2 % (ref 50–70)
Neutrophils Absolute: 7.65 10*3/uL — ABNORMAL HIGH (ref 1.5–7.5)
Platelets: 194 (ref 130–400)
RBC: 5.16 MIL/uL (ref 4.7–6.1)
RDW: 13 % (ref 11.0–14.0)
WBC: 11.04 10*3/uL — ABNORMAL HIGH (ref 4.8–10.8)

## 2011-08-10 LAB — GLUCOSE TOLERANCE TEST
Glucose Accuchek: 152 MG/DL
Glucose Accuchek: 186 MG/DL

## 2011-08-16 ENCOUNTER — Encounter

## 2011-08-17 MED ORDER — NORPACE CR 150 MG PO CP12
150 MG | ORAL_CAPSULE | ORAL | Status: DC
Start: 2011-08-17 — End: 2012-10-11

## 2011-08-23 ENCOUNTER — Inpatient Hospital Stay
Admit: 2011-08-23 | Disposition: A | Discharge status: Skilled Nursing Facility | Attending: Internal Medicine | Admitting: Internal Medicine

## 2011-08-23 LAB — BASIC METABOLIC PANEL
Anion Gap: 9 MEQ/L (ref 7–19)
BUN: 20 MG/DL (ref 6–20)
CO2: 25 MEQ/L (ref 23–29)
Calcium: 9.8 MG/DL (ref 8.5–10.3)
Chloride: 101 MEQ/L (ref 98–111)
Creatinine: 1.1 MG/DL (ref 0.3–1.3)
Gfr Calculated: 68 mL/min (ref 59–300)
Glucose: 110 MG/DL
Potassium: 3.7 mEq/L (ref 3.5–5.0)
Sodium: 135 MEQ/L — ABNORMAL LOW (ref 136–145)

## 2011-08-23 LAB — CBC PANEL ITEM
Basophils %: 0.3 % (ref 0–1)
Basophils: 0.04 10*3/uL (ref 0.0–0.20)
Eosinophils %: 0.13 10*3/uL (ref 0.0–0.60)
Eosinophils %: 1.1 % (ref 1–5)
Hematocrit: 42.1 % (ref 42–52)
Hemoglobin: 14.9 G/DL (ref 14.0–18.0)
Lymphocytes: 1.82 10*3/uL (ref 1.1–4.5)
Lymphocytes: 15.1 % — ABNORMAL LOW (ref 20–40)
MCH: 31.5 PG — ABNORMAL HIGH (ref 27–31)
MCHC: 35.4 G/DL (ref 33–37)
MCV: 89 FL (ref 80–94)
MPV: 9.2 FL — ABNORMAL LOW (ref 10.2–13.2)
Monocytes %: 9.6 % (ref 0–10)
Monocytes: 1.16 10*3/uL — ABNORMAL HIGH (ref 0.0–0.9)
Neutrophils %: 73.9 % — ABNORMAL HIGH (ref 50–70)
Neutrophils Absolute: 8.92 10*3/uL — ABNORMAL HIGH (ref 1.5–7.5)
Platelets: 232 (ref 130–400)
RBC: 4.73 MIL/uL (ref 4.7–6.1)
RDW: 13.7 % (ref 11.0–14.0)
WBC: 12.07 10*3/uL — ABNORMAL HIGH (ref 4.8–10.8)

## 2011-08-23 LAB — GLUCOSE TOLERANCE TEST: Glucose Accuchek: 117 MG/DL

## 2011-08-23 LAB — TROPONIN: Troponin I: 0.01 NG/ML (ref 0.000–0.06)

## 2011-08-23 LAB — POCT TROPONIN: POC Troponin I: 0 NG/ML — ABNORMAL LOW (ref 0.000–0.08)

## 2011-08-23 LAB — PROTIME-INR
INR: 1.08 (ref 0.90–1.10)
Protime: 13.5 s (ref 11.5–14.1)

## 2011-08-23 LAB — APTT: PTT: 26.4 s (ref 23.4–36.4)

## 2011-08-24 LAB — BASIC METABOLIC PANEL
Anion Gap: 9 MEQ/L (ref 7–19)
BUN: 14 MG/DL (ref 6–20)
CO2: 25 MEQ/L (ref 23–29)
Calcium: 9.3 MG/DL (ref 8.5–10.3)
Chloride: 100 MEQ/L (ref 98–111)
Creatinine: 0.9 MG/DL (ref 0.3–1.3)
Gfr Calculated: 86 mL/min (ref 59–300)
Glucose: 113 MG/DL
Potassium: 3.6 mEq/L (ref 3.5–5.0)
Sodium: 134 MEQ/L — ABNORMAL LOW (ref 136–145)

## 2011-08-24 LAB — CARDIAC ED PROFILE
Ck Isoenzymes: 50 IU/L (ref 22–269)
Ck Isoenzymes: 28 IU/L (ref 22–269)
Ck Isoenzymes: 31 IU/L (ref 22–269)
Myoglobin: 42 NG/ML (ref 15–110)
Myoglobin: 33 NG/ML (ref 15–110)
Myoglobin: 44 NG/ML (ref 15–110)
Troponin I: 0.02 NG/ML (ref 0.000–0.06)
Troponin I: 0.02 NG/ML (ref 0.000–0.06)
Troponin I: 0.01 NG/ML (ref 0.000–0.06)

## 2011-08-24 LAB — CBC PANEL ITEM
Basophils %: 0.2 % (ref 0–1)
Basophils: 0.02 10*3/uL (ref 0.0–0.20)
Eosinophils %: 0.12 10*3/uL (ref 0.0–0.60)
Eosinophils %: 1.1 % (ref 1–5)
Hematocrit: 44.2 % (ref 42–52)
Hemoglobin: 15.3 G/DL (ref 14.0–18.0)
Lymphocytes: 1.32 10*3/uL (ref 1.1–4.5)
Lymphocytes: 11.8 % — ABNORMAL LOW (ref 20–40)
MCH: 31.1 PG — ABNORMAL HIGH (ref 27–31)
MCHC: 34.6 G/DL (ref 33–37)
MCV: 89.8 FL (ref 80–94)
MPV: 9.2 FL — ABNORMAL LOW (ref 10.2–13.2)
Monocytes %: 11.8 % — ABNORMAL HIGH (ref 0–10)
Monocytes: 1.32 10*3/uL — ABNORMAL HIGH (ref 0.0–0.9)
Neutrophils %: 75.1 % — ABNORMAL HIGH (ref 50–70)
Neutrophils Absolute: 8.44 10*3/uL — ABNORMAL HIGH (ref 1.5–7.5)
Platelets: 221 (ref 130–400)
RBC: 4.92 MIL/uL (ref 4.7–6.1)
RDW: 13.7 % (ref 11.0–14.0)
WBC: 11.22 10*3/uL — ABNORMAL HIGH (ref 4.8–10.8)

## 2011-08-25 ENCOUNTER — Inpatient Hospital Stay: Admit: 2011-08-25 | Disposition: A | Discharge status: Home Health | Admitting: Internal Medicine

## 2011-08-25 LAB — CBC PANEL ITEM
Basophils %: 0.2 % (ref 0–1)
Basophils: 0.02 10*3/uL (ref 0.0–0.20)
Eosinophils %: 0.1 10*3/uL (ref 0.0–0.60)
Eosinophils %: 1.1 % (ref 1–5)
Hematocrit: 42.9 % (ref 42–52)
Hemoglobin: 14.8 G/DL (ref 14.0–18.0)
Lymphocytes: 1.16 10*3/uL (ref 1.1–4.5)
Lymphocytes: 12.3 % — ABNORMAL LOW (ref 20–40)
MCH: 30.9 PG (ref 27–31)
MCHC: 34.5 G/DL (ref 33–37)
MCV: 89.6 FL (ref 80–94)
MPV: 9.3 FL — ABNORMAL LOW (ref 10.2–13.2)
Monocytes %: 12.2 % — ABNORMAL HIGH (ref 0–10)
Monocytes: 1.15 10*3/uL — ABNORMAL HIGH (ref 0.0–0.9)
Neutrophils %: 74.2 % — ABNORMAL HIGH (ref 50–70)
Neutrophils Absolute: 7 10*3/uL (ref 1.5–7.5)
Platelets: 201 (ref 130–400)
RBC: 4.79 MIL/uL (ref 4.7–6.1)
RDW: 13.8 % (ref 11.0–14.0)
WBC: 9.43 10*3/uL (ref 4.8–10.8)

## 2011-08-25 LAB — BASIC METABOLIC PANEL
Anion Gap: 6 MEQ/L — ABNORMAL LOW (ref 7–19)
BUN: 16 MG/DL (ref 6–20)
CO2: 29 MEQ/L (ref 23–29)
Calcium: 9.5 MG/DL (ref 8.5–10.3)
Chloride: 99 MEQ/L (ref 98–111)
Creatinine: 1 MG/DL (ref 0.3–1.3)
Gfr Calculated: 76 mL/min (ref 59–300)
Glucose: 115 MG/DL
Potassium: 3.6 mEq/L (ref 3.5–5.0)
Sodium: 134 MEQ/L — ABNORMAL LOW (ref 136–145)

## 2011-08-26 LAB — GLUCOSE TOLERANCE TEST
Glucose Accuchek: 170 MG/DL
Glucose Accuchek: 125 MG/DL
Glucose Accuchek: 150 MG/DL

## 2011-08-27 LAB — GLUCOSE TOLERANCE TEST
Glucose Accuchek: 117 MG/DL
Glucose Accuchek: 118 MG/DL
Glucose Accuchek: 122 MG/DL
Glucose Accuchek: 136 MG/DL
Glucose Accuchek: 144 MG/DL

## 2011-08-28 LAB — CBC PANEL ITEM
Basophils %: 0.1 % (ref 0–1)
Basophils: 0.01 10*3/uL (ref 0.0–0.20)
Eosinophils %: 0.15 10*3/uL (ref 0.0–0.60)
Eosinophils %: 1.9 % (ref 1–5)
Hematocrit: 39.6 % — ABNORMAL LOW (ref 42–52)
Hemoglobin: 14.1 G/DL (ref 14.0–18.0)
Lymphocytes: 1.04 10*3/uL — ABNORMAL LOW (ref 1.1–4.5)
Lymphocytes: 13.1 % — ABNORMAL LOW (ref 20–40)
MCH: 31.1 PG — ABNORMAL HIGH (ref 27–31)
MCHC: 35.6 G/DL (ref 33–37)
MCV: 87.4 FL (ref 80–94)
MPV: 8.9 FL — ABNORMAL LOW (ref 10.2–13.2)
Monocytes %: 13.1 % — ABNORMAL HIGH (ref 0–10)
Monocytes: 1.04 10*3/uL — ABNORMAL HIGH (ref 0.0–0.9)
Neutrophils %: 71.8 % — ABNORMAL HIGH (ref 50–70)
Neutrophils Absolute: 5.69 10*3/uL (ref 1.5–7.5)
Platelets: 174 (ref 130–400)
RBC: 4.53 MIL/uL — ABNORMAL LOW (ref 4.7–6.1)
RDW: 13.7 % (ref 11.0–14.0)
WBC: 7.93 10*3/uL (ref 4.8–10.8)

## 2011-08-28 LAB — BASIC METABOLIC PANEL
Anion Gap: 6 MEQ/L — ABNORMAL LOW (ref 7–19)
BUN: 18 MG/DL (ref 6–20)
CO2: 30 MEQ/L — ABNORMAL HIGH (ref 23–29)
Calcium: 9.6 MG/DL (ref 8.5–10.3)
Chloride: 93 MEQ/L — ABNORMAL LOW (ref 98–111)
Creatinine: 0.9 MG/DL (ref 0.3–1.3)
Gfr Calculated: 86 mL/min (ref 59–300)
Glucose: 96 MG/DL
Potassium: 3.4 mEq/L — ABNORMAL LOW (ref 3.5–5.0)
Sodium: 129 MEQ/L — ABNORMAL LOW (ref 136–145)

## 2011-08-28 LAB — GLUCOSE TOLERANCE TEST
Glucose Accuchek: 113 MG/DL
Glucose Accuchek: 117 MG/DL
Glucose Accuchek: 117 MG/DL

## 2011-08-29 LAB — BASIC METABOLIC PANEL
Anion Gap: 8 MEQ/L (ref 7–19)
BUN: 16 MG/DL (ref 6–20)
CO2: 28 MEQ/L (ref 23–29)
Calcium: 9.2 MG/DL (ref 8.5–10.3)
Chloride: 93 MEQ/L — ABNORMAL LOW (ref 98–111)
Creatinine: 0.9 MG/DL (ref 0.3–1.3)
Gfr Calculated: 86 mL/min (ref 59–300)
Glucose: 97 MG/DL
Potassium: 3.7 mEq/L (ref 3.5–5.0)
Sodium: 129 MEQ/L — ABNORMAL LOW (ref 136–145)

## 2011-08-29 LAB — GLUCOSE TOLERANCE TEST
Glucose Accuchek: 113 MG/DL
Glucose Accuchek: 133 MG/DL
Glucose Accuchek: 125 MG/DL
Glucose Accuchek: 120 MG/DL

## 2011-08-30 LAB — GLUCOSE TOLERANCE TEST
Glucose Accuchek: 112 MG/DL
Glucose Accuchek: 113 MG/DL
Glucose Accuchek: 124 MG/DL

## 2011-08-30 LAB — PROTIME-INR
INR: 1.08 (ref 0.90–1.10)
Protime: 13.5 s (ref 11.5–14.1)

## 2011-08-30 LAB — BASIC METABOLIC PANEL
Anion Gap: 9 MEQ/L (ref 7–19)
BUN: 14 MG/DL (ref 6–20)
CO2: 25 MEQ/L (ref 23–29)
Calcium: 9.1 MG/DL (ref 8.5–10.3)
Chloride: 94 MEQ/L — ABNORMAL LOW (ref 98–111)
Creatinine: 0.9 MG/DL (ref 0.3–1.3)
Gfr Calculated: 86 mL/min (ref 59–300)
Glucose: 89 MG/DL
Potassium: 3.4 mEq/L — ABNORMAL LOW (ref 3.5–5.0)
Sodium: 128 MEQ/L — ABNORMAL LOW (ref 136–145)

## 2011-08-30 LAB — CBC PANEL ITEM
Basophils %: 0.2 % (ref 0–1)
Basophils: 0.01 10*3/uL (ref 0.0–0.20)
Eosinophils %: 0.07 10*3/uL (ref 0.0–0.60)
Eosinophils %: 1.1 % (ref 1–5)
Hematocrit: 38.4 % — ABNORMAL LOW (ref 42–52)
Hemoglobin: 13.8 G/DL — ABNORMAL LOW (ref 14.0–18.0)
Lymphocytes: 1.19 10*3/uL (ref 1.1–4.5)
Lymphocytes: 18.4 % — ABNORMAL LOW (ref 20–40)
MCH: 30.7 PG (ref 27–31)
MCHC: 35.9 G/DL (ref 33–37)
MCV: 85.5 FL (ref 80–94)
MPV: 9.2 FL — ABNORMAL LOW (ref 10.2–13.2)
Monocytes %: 14.4 % — ABNORMAL HIGH (ref 0–10)
Monocytes: 0.93 10*3/uL — ABNORMAL HIGH (ref 0.0–0.9)
Neutrophils %: 65.9 % (ref 50–70)
Neutrophils Absolute: 4.28 10*3/uL (ref 1.5–7.5)
Platelets: 163 (ref 130–400)
RBC: 4.49 MIL/uL — ABNORMAL LOW (ref 4.7–6.1)
RDW: 13.5 % (ref 11.0–14.0)
WBC: 6.48 10*3/uL (ref 4.8–10.8)

## 2011-08-30 LAB — APTT: PTT: 29.8 s (ref 23.4–36.4)

## 2011-09-04 NOTE — Telephone Encounter (Signed)
Wife called to report sitting BP 123/57 and standing BP 95/52.  Recently in hospital and has new medications prescribed by Dr. Loura Back.  Wife has contacted Dr. Blanchie Serve office regarding new meds, just wanted to update this office as to current problem.

## 2011-09-15 NOTE — Telephone Encounter (Signed)
Dr. Loura Back has been managing patient's BP.  Advised wife to contact his office -possibly schedule f/u appt.  Wife reported hospital follow next Tuesday.

## 2011-09-15 NOTE — Telephone Encounter (Signed)
Mrs. Larmon calling regarding her husband, Mark Franklin. He recently has gotton out of hospital and she has some questions about his meds. Also, when he gets up his BP drops. Please call at [phone].

## 2011-09-21 NOTE — Telephone Encounter (Signed)
Dose adj

## 2011-10-06 NOTE — Patient Instructions (Addendum)
Mark Franklin will call you in 3 mos to check pacemaker on phone  Follow up in 6 mos With Dr. Chase Picket  Call with any questions or concerns  Follow up with Dr. Loura Back  for non cardiac problems  Report any new problems  Cardiovascular Fitness- be as active as possible  Cardiac / Healthy Diet  Continue current medications as directed  Continue plan of treatment    Orthostatic Hypotension: After Your Visit  Your Care Instructions  Orthostatic hypotension is a quick drop in blood pressure that can happen when you get up from sitting or lying down. You may faint or feel lightheaded or dizzy. When a person sits up or stands up, the body changes the way it pumps blood to keep blood flowing to the brain. If these changes occur too slowly after you stand up, blood flow to the brain may slow briefly. This can make you feel lightheaded.  Many medicines can cause this condition, especially in older people. Lack of fluids (dehydration) or illnesses such as diabetes or heart disease also can cause it.  Follow-up care is a key part of your treatment and safety. Be sure to make and go to all appointments, and call your doctor if you are having problems. It's also a good idea to know your test results and keep a list of the medicines you take.  How can you care for yourself at home?   Tell your doctor about any problems you have after taking your medicines.   If your doctor prescribes medicine to help prevent a low blood pressure problem, take it exactly as prescribed. Call your doctor if you think you are having a problem with your medicine.   Drink plenty of fluids, enough so that your urine is light yellow or clear like water. Choose water and other caffeine-free clear liquids. If you have kidney, heart, or liver disease and have to limit fluids, talk with your doctor before you increase the amount of fluids you drink.   Limit or avoid alcohol and caffeine.   Get up slowly from bed or after sitting for a long time. If you are in  bed, roll to your side and swing your legs over the edge of the bed and onto the floor. Push your body up to a sitting position. Wait for a while before slowly standing up. If you are dizzy or lightheaded, sit or lie down.  When should you call for help?  Call 911 anytime you think you may need emergency care. For example, call if:   You passed out (lost consciousness).  Watch closely for changes in your health, and be sure to contact your doctor if:   You do not get better as expected.    Where can you learn more?    Go to [email or web address] and sign in to your MyChart account. Enter [phone] in the Search Health Information box to learn more about "Orthostatic Hypotension: After Your Visit."    If you do not have an account, please click on the "Sign Up Now" link.       2006-2013 [employer]. Care instructions adapted under license by [facility]. This care instruction is for use with your licensed healthcare professional. If you have questions about a medical condition or this instruction, always ask your healthcare professional. Healthwise, Incorporated disclaims any warranty or liability for your use of this information.  Content Version: 9.7.130178; Last Revised: February 03, 2010

## 2011-10-06 NOTE — Progress Notes (Signed)
[facility] [place], [place]  [address], [place]  [address]  Phone: [phone]  Fax: [phone]    OFFICE VISIT:  10/06/2011    Mark Franklin - DOB: [birth year]    Reason For Visit:  Mark Franklin is a 78 y.o. male who is here for Follow-Up from Hospital, Coronary Artery Disease and Hypertension   from a recent hospitalization for pacemaker generator change.     Subjective  Mark Franklin denies exertional chest pain.  He has some DOE but no orthopnea, paroxysmal nocturnal dyspnea, syncope, presyncope, arrythmia, edema and fatigue.  The patient denies numbness or weakness to suggest cerebrovascular accident or transient ischemic attack.    He gets very dizzy when standing. His wife states his BP drops when he stands up.  Dr Loura Back stopped his norvasc.     Mark Franklin has the following history as recorded in EpicCare:    Patient Active Problem List    Diagnosis Date Noted   . Pacemaker    . CAD (coronary artery disease)    . Hyperlipidemia    . HYPERTENSION    . Diabetes mellitus    . PUD (peptic ulcer disease)    . Polyarticular arthritis      Past Medical History   Diagnosis Date   . CAD (coronary artery disease)    . Hyperlipidemia    . Hypertension    . Diabetes mellitus    . PUD (peptic ulcer disease)    . Polyarticular arthritis    . Pacemaker 08/23/2011     generator replacement     Past Surgical History   Procedure Laterality Date   . Cardiac pacemaker placement     . Cataract removal     . Carpal tunnel release     . Finger trigger release     . Cardiac catheterization  132440     Ef 60%   . Cardiac catheterization  020801   . Cardiac catheterization  0801   . Cardiac catheterization  102725     EF 50%     Family History   Problem Relation Age of Onset   . Coronary Art Dis Other      family history     History   Substance Use Topics   . Smoking status: Former Games developer   . Smokeless tobacco: Not on file   . Alcohol Use: Not on file      Current Outpatient Prescriptions   Medication Sig  Dispense Refill   . polyethylene glycol (GLYCOLAX) powder Take 17 g by mouth daily.       . furosemide (LASIX) 20 MG tablet Take 20 mg by mouth 2 times daily.       Marland Kitchen docusate sodium (COLACE) 100 MG capsule Take 100 mg by mouth 2 times daily.       . bacitracin-neomycin-polymyxin b-hydrocortisone 1 % ointment Apply  topically 2 times daily. Apply topically 2 times daily.       . metoclopramide (REGLAN) 5 MG tablet Take 5 mg by mouth 4 times daily.       . Ascorbic Acid (VITAMIN C) 250 MG tablet Take 500 mg by mouth daily.       Marland Kitchen ibuprofen (ADVIL;MOTRIN) 200 MG tablet Take 200 mg by mouth every 6 hours as needed.       Marland Kitchen losartan (COZAAR) 50 MG tablet Take 50 mg by mouth daily.         . tamsulosin (FLOMAX)  0.4 MG capsule Take 0.4 mg by mouth daily.         Marland Kitchen LIPITOR 80 MG tablet TAKE 1 TABLET BY MOUTH ONCE DAILY  90 tablet  1   . aspirin 325 MG tablet Take 325 mg by mouth daily.         . metformin (GLUCOPHAGE) 1000 MG tablet Take 500 mg by mouth daily (with breakfast).       Marland Kitchen omeprazole (PRILOSEC) 20 MG capsule Take 20 mg by mouth 2 times daily.         . NORPACE CR 150 MG CR capsule TAKE (1) CAPSULE BY MOUTH TWICE DAILY.  180 capsule  3     No current facility-administered medications for this visit.     Allergies: Review of patient's allergies indicates no known allergies.    Objective  Vital Signs - BP 102/64  Pulse 72  Ht 5\' 8"  (1.727 m)  Wt 178 lb (80.74 kg)  BMI 27.07 kg/m2  General - Mark Franklin is alert, cooperative, and pleasant.  No acute distress.      HEENT - The head is normocephalic.  Neck - Supple, without increased jugular venous pressures.  No carotid bruits.    Lungs - Lungs are clear bilaterally.  No wheezes or rales.    Cardiovascular - Heart has regular rate and rhythm.  No murmurs, rubs or gallops.      Abdominal -  Soft, nontender, nondistended.  Bowel sounds are intact.   Extremities - No clubbing, cyanosis, or edema.   Musculoskeletal - No musculoskeletal symptoms.   Pacemaker site at left  upper chest is well healed, no redness or drainage.  Neurological - No focal signs are identified. Cranial nerves II through XII are grossly intact.    Assessment:    1. Pacemaker  Pacer Interrogate   2. CAD (coronary artery disease)     3. Hyperlipidemia     4. Hypertension     5. Orthostatic hypotension       Stable cardiovascular status. No evidence of overt heart failure, angina or dysrhythmia.   Device interrogation shows appropriate diagnostics without sustained arrhythmias.  Discussed medications and prevention of symptoms with orthostatic hypotension.   Plan  Darl Pikes will call you in 3 mos to check pacemaker on phone  Follow up in 6 mos With Dr. Chase Picket  Call with any questions or concerns  Follow up with Dr. Loura Back  for non cardiac problems  Report any new problems  Cardiovascular Fitness- be as active as possible  Cardiac / Healthy Diet  Continue current medications as directed  Continue plan of treatment- educational material provided about orthostatic hypotension.     Octavia Bruckner, APRN

## 2011-11-08 LAB — BASIC METABOLIC PANEL
Anion Gap: 6 MEQ/L — ABNORMAL LOW (ref 7–19)
BUN: 15 MG/DL (ref 6–20)
CO2: 29 MEQ/L (ref 23–29)
Calcium: 9.7 MG/DL (ref 8.5–10.3)
Chloride: 107 MEQ/L (ref 98–111)
Creatinine: 1.1 MG/DL (ref 0.3–1.3)
Gfr Calculated: 68 mL/min (ref 59–300)
Glucose: 122 MG/DL
Potassium: 4 mEq/L (ref 3.5–5.0)
Sodium: 142 MEQ/L (ref 136–145)

## 2011-11-16 ENCOUNTER — Encounter

## 2011-11-16 MED ORDER — LIPITOR 80 MG PO TABS
80 MG | ORAL_TABLET | ORAL | Status: DC
Start: 2011-11-16 — End: 2012-02-08

## 2012-02-08 ENCOUNTER — Encounter

## 2012-02-08 MED ORDER — LIPITOR 80 MG PO TABS
80 MG | ORAL_TABLET | ORAL | Status: DC
Start: 2012-02-08 — End: 2013-10-22

## 2012-02-08 NOTE — Telephone Encounter (Signed)
[name] called today inquiring as to rather or not we could use labs drawn for Mark Franklin' PCP doctor about a week ago. Please call. Thank you.

## 2012-02-08 NOTE — Telephone Encounter (Signed)
Called patient back and explained it was fine to use labs from PCP but she needed to call and have them faxed to [place].  She voiced understanding.

## 2012-02-08 NOTE — Telephone Encounter (Signed)
Patient needs cholesterol check before he can have anymore refill.  Called patient to notify them of this.  Per Boyd Kerbs give one refill and order labs and go ahead and have drawn.

## 2012-02-12 NOTE — Telephone Encounter (Signed)
Patients wife called in to go over his medications and to ask if we received labs from Dr Loura Back,  I explained we had not and she stated she would call the office to get the results so we could fill his med for cholesterol.

## 2012-04-03 NOTE — Patient Instructions (Addendum)
   Continue your current medications.   Return for wellness visit in six months and your cardiologist will see you in a year.     If you should have any cardiac questions or concerns prior to your next appointment, please call our office.    On 07/04/2012 you will be due for a pacemaker check over the phone.

## 2012-04-04 NOTE — Progress Notes (Signed)
[facility] [place], [place].  [facility]  [address], [place]  [address]  Phone: [phone]  Fax: [phone]  Office Visit:  04/03/2012    Mark Franklin  Male, 78 y.o., DOB: 26-Aug-1928     Subjective:   Mark Franklin presents today doing very well.  There is no angina, no overt heart failure, no syncope, and no stroke-like problems.  He is headed to [place] for Christmas.      Mark Franklin has the following history as recorded in EpicCare:  Patient Active Problem List   Diagnosis Code   . CAD (coronary artery disease) 414.00   . Hyperlipidemia 272.4   . HYPERTENSION 401.9   . Diabetes mellitus 250.00   . PUD (peptic ulcer disease) 533.90   . 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 Pacemaker V45.01     Past Medical History   Diagnosis Date   . CAD (coronary artery disease)    . Hyperlipidemia    . Hypertension    . Diabetes mellitus    . PUD (peptic ulcer disease)    . Polyarticular arthritis    . Pacemaker 08/23/2011     generator replacement     Past Surgical History   Procedure Laterality Date   . Cardiac pacemaker placement     . Cataract removal     . Carpal tunnel release     . Finger trigger release     . Cardiac catheterization  130865     Ef 60%   . Cardiac catheterization  020801   . Cardiac catheterization  0801   . Cardiac catheterization  784696     EF 50%     Family History   Problem Relation Age of Onset   . Coronary Art Dis Other      family history     History   Substance Use Topics   . Smoking status: Former Games developer   . Smokeless tobacco: Not on file   . Alcohol Use: Not on file     Allergies: Review of patient's allergies indicates no known allergies.    Outpatient Prescriptions Marked as Taking for the 04/03/12 encounter (Office Visit) with Krystal Clark, MD   Medication Sig Dispense Refill   . Loratadine 10 MG CAPS Take  by mouth.       Marland Kitchen LIPITOR 80 MG tablet TAKE 1 TABLET BY MOUTH ONCE DAILY **LFTS AND LIPIDS DUE BEFORE REFILLS CAN BE  GIVEN**  30 tablet  0   . polyethylene glycol (GLYCOLAX) powder Take 17 g by mouth daily.       . furosemide (LASIX) 20 MG tablet Take 20 mg by mouth 2 times daily.       Marland Kitchen docusate sodium (COLACE) 100 MG capsule Take 100 mg by mouth 2 times daily.       . bacitracin-neomycin-polymyxin b-hydrocortisone 1 % ointment Apply  topically 2 times daily. Apply topically 2 times daily.       . NORPACE CR 150 MG CR capsule TAKE (1) CAPSULE BY MOUTH TWICE DAILY.  180 capsule  3   . Ascorbic Acid (VITAMIN C) 250 MG tablet Take 500 mg by mouth daily.       Marland Kitchen ibuprofen (ADVIL;MOTRIN) 200 MG tablet Take 200 mg by mouth every 6 hours as needed.       Marland Kitchen losartan (COZAAR) 50 MG tablet Take 50 mg by mouth daily.         Marland Kitchen  tamsulosin (FLOMAX) 0.4 MG capsule Take 0.4 mg by mouth daily.         Marland Kitchen aspirin 325 MG tablet Take 325 mg by mouth daily.         . metformin (GLUCOPHAGE) 1000 MG tablet Take 500 mg by mouth daily (with breakfast).       Marland Kitchen omeprazole (PRILOSEC) 20 MG capsule Take 20 mg by mouth 2 times daily.             Objective:   BP 140/82  Pulse 80  Ht 5' 8.5" (1.74 m)  Wt 183 lb 8 oz (83.235 kg)  BMI 27.49 kg/m2    GENERAL:  The patient is a pleasant male in no apparent distress.   HEENT:  The head is normocephalic.  NECK:  Supple without increased jugular venous pressures.       LUNGS:  The lungs are clear.      HEART:  The heart rhythm is regular without appreciable murmur or rub.  ABDOMEN:  The abdomen is soft and bowel sounds are intact.   EXTREMITIES:  There is no clubbing, cyanosis or edema.   NEUROLOGICAL:  Cranial nerves II through XII are grossly intact.      Assessment:    1.  Stable cardiovascular status    Patient was examined for:  1. Coronary artery disease    2. Hypertension    3. Hyperlipidemia    4. Cardiac pacemaker        Plan:   1.  Continue current medications.   2.  Return for wellness visit in six months and see me in a year.  He was instructed to call our office should he have any concerns or  problems prior to his next appointment.    Judith Blonder. Chase Picket, M.D., Ph.D., F.A.C.C.  [facility] [place], [place].     Cc:   Verlin Dike, M.D.  [address]  [place] [address]  [phone] phone  [phone] fax

## 2012-10-04 NOTE — Progress Notes (Signed)
[facility] [place] [place]  [address], [place]  [address]  Phone: [phone]  Fax: [phone]    OFFICE VISIT:  10/04/2012    Mark Franklin - DOB: [birth year]    Reason For Visit:  Indy is a 79 y.o. male who is here for 6 Month Follow-Up and Coronary Artery Disease      Subjective  Kerrington denies exertional chest pain, shortness of breath, orthopnea, paroxysmal nocturnal dyspnea, syncope, presyncope, arrythmia, edema and fatigue.  The patient denies numbness or weakness to suggest cerebrovascular accident or transient ischemic attack.    Dr Loura Back is PCP and follows labs including lipids. He is active and doing well.   Harle Battiest has the following history as recorded in EpicCare:    Patient Active Problem List    Diagnosis Date Noted   . Other specified cardiac dysrhythmias(427.89)    . Pacemaker    . CAD (coronary artery disease)    . Hyperlipidemia    . HYPERTENSION    . Diabetes mellitus    . PUD (peptic ulcer disease)    . Polyarticular arthritis      Past Medical History   Diagnosis Date   . CAD (coronary artery disease)    . Hyperlipidemia    . Hypertension    . Diabetes mellitus (HCC)    . PUD (peptic ulcer disease)    . Polyarticular arthritis    . Pacemaker 08/23/2011     generator replacement   . Other specified cardiac dysrhythmias(427.89)      Past Surgical History   Procedure Laterality Date   . Cardiac pacemaker placement     . Cataract removal     . Carpal tunnel release     . Finger trigger release     . Cardiac catheterization  440102     Ef 60%   . Cardiac catheterization  020801   . Cardiac catheterization  0801   . Cardiac catheterization  725366     EF 50%     Family History   Problem Relation Age of Onset   . Coronary Art Dis Other      family history     History   Substance Use Topics   . Smoking status: Former Games developer   . Smokeless tobacco: Not on file   . Alcohol Use: Not on file      Current Outpatient Prescriptions   Medication Sig Dispense Refill   .  Iron 66 MG TABS Take  by mouth.       . Loratadine 10 MG CAPS Take  by mouth.       Marland Kitchen LIPITOR 80 MG tablet TAKE 1 TABLET BY MOUTH ONCE DAILY **LFTS AND LIPIDS DUE BEFORE REFILLS CAN BE GIVEN**  30 tablet  0   . polyethylene glycol (GLYCOLAX) powder Take 17 g by mouth daily.       . furosemide (LASIX) 20 MG tablet Take 20 mg by mouth daily.       Marland Kitchen docusate sodium (COLACE) 100 MG capsule Take 100 mg by mouth 2 times daily.       . bacitracin-neomycin-polymyxin b-hydrocortisone 1 % ointment Apply  topically 2 times daily. Apply topically 2 times daily.       . NORPACE CR 150 MG CR capsule TAKE (1) CAPSULE BY MOUTH TWICE DAILY.  180 capsule  3   . Ascorbic Acid (VITAMIN C) 250 MG tablet Take 500 mg by mouth daily.       Marland Kitchen  ibuprofen (ADVIL;MOTRIN) 200 MG tablet Take 200 mg by mouth every 6 hours as needed.       Marland Kitchen losartan (COZAAR) 50 MG tablet Take 50 mg by mouth daily.         . tamsulosin (FLOMAX) 0.4 MG capsule Take 0.4 mg by mouth daily.         Marland Kitchen aspirin 325 MG tablet Take 81 mg by mouth daily.       . metformin (GLUCOPHAGE) 1000 MG tablet Take 500 mg by mouth daily (with breakfast).       Marland Kitchen omeprazole (PRILOSEC) 20 MG capsule Take 20 mg by mouth 2 times daily.           No current facility-administered medications for this visit.     Allergies: Review of patient's allergies indicates no known allergies.    Review of Systems  Constitutional - no significant activity change, appetite change, or unexpected weight change. No fever, chills or diaphoresis.  No fatigue.   HEENT - no significant rhinorrhea or epistaxis. No tinnitus or significant hearing loss.   Eyes - no sudden vision change or amaurosis.   Respiratory - no significant wheezing, stridor, apnea or cough.  No dyspnea on exertion or shortness of breath.  Cardiovascular - no exertional chest pain, orthopnea or PND.  No sensation of arrythmia or slow heart rate.   No claudication or leg edema.  Gastrointestinal - no abdominal swelling or pain. No blood in  stool. No severe constipation, diarrhea, nausea, or vomiting.   Genitourinary - no difficulty urinating, dysuria, frequency, or urgency. No flank pain or hematuria.   Musculoskeletal - no back pain, gait disturbance, or myalgia.   Skin - no color change or rash.  No pallor.  No new surgical incision.  Neurologic - no speech difficulty, facial asymmetry or lateralizing weakness.  No seizures, presyncope, syncope, or significant dizziness.  Hematologic - no easy bruising or excessive bleeding.   Psychiatric - no severe anxiety or insomnia.  No confusion.   All other review of systems are negative.      Objective  Vital Signs - BP 132/78  Pulse 56  Ht 5\' 8"  (1.727 m)  Wt 186 lb 12.8 oz (84.732 kg)  BMI 28.41 kg/m2  General - Norberto is alert, cooperative, and pleasant.  Well groomed.  No acute distress.    Body habitus is normal.  HEENT - The head is normocephalic. No circumoral cyanosis.  Dentition is normal.   EYES -  No Xanthelasma, no arcus senilis, no conjunctival hemorrhages or discharge.   Neck - Supple, without increased jugular venous pressures.  No carotid bruits.  No mass.   Respiratory - Lungs are clear bilaterally.  No wheezes or rales.  Normal effort without use of accessory muscles.  Cardiovascular - Heart has regular rhythm and rate.  No murmurs, rubs or gallops.    + pedal pulses and no varicosities.      Abdominal -  Soft, nontender, nondistended.  Bowel sounds are intact.   Extremities - No clubbing, cyanosis, or  edema.   Musculoskeletal - No musculoskeletal symptoms.  No clubbing .  No Osler's nodes.   Gait normal .  No kyphosis or scoliosis.   Skin -  no statis ulcers or dermatitis.  Neurological - No focal signs are identified.  Oriented to person, place and time.    Psychiatric -  Appropriate affect and mood.       Assessment:    1. CAD (coronary artery  disease)    2. Hypertension    3. Pacemaker    Normal Lexiscan 2/12  Pacemaker check showed adequate battery status and  appropriate  diagnostics without any sustained arrhythmias.   Stable cardiovascular status. No evidence of overt heart failure, angina or dysrhythmia.     Plan    Follow up in 6 mos With Dr. Pamelia Hoit will call in 3 mos for home pacemaker check  Call with any questions or concerns  Follow up with Dr Loura Back for non cardiac problems  Report any new problems  Cardiovascular Fitness-Exercise as tolerated.  Strive for 15 minutes of exercise most days of the week.    Cardiac / Healthy Diet  Continue current medications as directed  Continue plan of treatment  It is always recommended that you bring your medications bottles with you to each visit - this is for your safety!       Octavia Bruckner, APRN

## 2012-10-04 NOTE — Patient Instructions (Signed)
Follow up in 6 mos With Dr. Pamelia Hoit will call in 3 mos for home pacemaker check  Call with any questions or concerns  Follow up with PCP for non cardiac problems  Report any new problems  Cardiovascular Fitness-Exercise as tolerated.  Strive for 15 minutes of exercise most days of the week.    Cardiac / Healthy Diet  Continue current medications as directed  Continue plan of treatment  It is always recommended that you bring your medications bottles with you to each visit - this is for your safety!

## 2012-10-10 ENCOUNTER — Encounter

## 2012-10-11 ENCOUNTER — Encounter

## 2012-10-11 MED ORDER — DISOPYRAMIDE PHOSPHATE 150 MG PO CAPS
150 MG | ORAL_CAPSULE | Freq: Two times a day (BID) | ORAL | Status: DC
Start: 2012-10-11 — End: 2013-08-06

## 2012-10-11 MED ORDER — DISOPYRAMIDE PHOSPHATE ER 150 MG PO CP12
150 MG | ORAL_CAPSULE | Freq: Two times a day (BID) | ORAL | Status: DC
Start: 2012-10-11 — End: 2013-04-23

## 2013-03-17 NOTE — Telephone Encounter (Signed)
Sounds good. Thanks, Ramona.  Aloha Gell, APRN

## 2013-03-17 NOTE — Telephone Encounter (Signed)
Per Larita Fife, APRN, instructed wife to monitor pt's BP and HR and call back around [place].  Wife reported pt is currently exhibiting no symptoms that she is aware of.

## 2013-03-17 NOTE — Telephone Encounter (Signed)
Wife reported pt took ALL his AM medications twice.  These meds did NOT include Glucophage, Motrin, or Lipitor -takes in the evening.  Please see med list.  Also of concern, wife reported pt takes Norpace 150 mg ONCE daily, stating "Dr. Chase Picket reduced dose from twice to once daily".       Per wife, pt is currently A&O X3.  Denies any symptoms at this time.  Please advise.

## 2013-03-17 NOTE — Telephone Encounter (Signed)
Wife reported pt states he "feels fine".  Denies any symptoms.  BP 188/86, HR 66.  She is going to reported back in a couple of hours.  If symptoms worsen, she understands to call 911.

## 2013-03-17 NOTE — Telephone Encounter (Signed)
Wife reported pt continues to be stable.  BP remains 130/75, HR 70's.  Denies any symptoms.  Advised, per APRN, hold any evening dose of cardiac meds for today, resume normal schedule tomorrow.  However, as previously reported, pt is only taking Norpace 150 mg one time daily as well as her cardiac meds.  Wife voiced understanding.

## 2013-03-17 NOTE — Telephone Encounter (Signed)
Ramona,   As per our discussion earlier, recommended that patient call back around 11-12 pm to report BP and HR.  Patient was advised to go to ER with problems per Ramona.  Please call and check on patient this afternoon i.e. BP, HR and general status.  Advise not to take additional cardiac meds today.  Thanks, [employer]

## 2013-03-17 NOTE — Telephone Encounter (Signed)
Wife reported BP 139/72 HR 86.  Denies any symptoms at present.

## 2013-04-23 NOTE — Patient Instructions (Signed)
 Please remember to bring all of your medications bottles to your appointment so that we can update your medication list.  Include all medications you take (including prescriptions, over-the-counter, vitamins, and herbals).  We ask that you bring your original medication bottles, as it is difficult to identify medications based on color and shape alone.      We are committed to providing you with the best care possible.  If you receive a survey after visiting one of our offices, please take time to share your experience concerning your physician office visit. These surveys are confidential and no health information about you is shared. We are eager to improve for you and we are counting on your feedback to help make that happen.     Allergies:  Review of patient's allergies indicates no known allergies.

## 2013-04-25 NOTE — Progress Notes (Signed)
[facility] [place], [place].  [facility]  [address], [place]  [address]  Phone: [phone]  Fax: [phone]  Office Visit:  04/23/2013    Mark Franklin DOB: 1929-03-11, Male, 80 y.o.     Subjective  Mr. Mark Franklin presents doing well.  There is no angina, no overt heart failure, no syncope, and no stroke-like problem.      Patient Active Problem List   Diagnosis Code   . CAD (coronary artery disease) 414.00   . Hyperlipidemia 272.4   . HYPERTENSION 401.9   . Diabetes mellitus 250.00   . PUD (peptic ulcer disease) 533.90   . 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 Pacemaker V45.01   . Other specified cardiac dysrhythmias(427.89) 427.89     Past Medical History   Diagnosis Date   . CAD (coronary artery disease)    . Hyperlipidemia      Cholesterol management per pcp.    . Hypertension    . Diabetes mellitus (HCC)    . PUD (peptic ulcer disease)    . Polyarticular arthritis    . Pacemaker 08/23/2011     generator replacement   . Other specified cardiac dysrhythmias(427.89)      Past Surgical History   Procedure Laterality Date   . Cardiac pacemaker placement     . Cataract removal     . Carpal tunnel release     . Finger trigger release     . Cardiac catheterization  130865     Ef 60%   . Cardiac catheterization  020801   . Cardiac catheterization  0801   . Cardiac catheterization  784696     EF 50%       Family History   Problem Relation Age of Onset   . Coronary Art Dis Other      family history       History   Substance Use Topics   . Smoking status: Former Games developer   . Smokeless tobacco: Not on file   . Alcohol Use: Not on file       Allergies: Review of patient's allergies indicates no known allergies.    Outpatient Prescriptions Marked as Taking for the 04/23/13 encounter (Office Visit) with Krystal Clark, MD   Medication Sig Dispense Refill   . aspirin 81 MG tablet Take 81 mg by mouth daily.       . disopyramide (NORPACE) 150 MG capsule Take 1 capsule by mouth 2  times daily.  180 capsule  3   . Iron 66 MG TABS Take  by mouth.       . Loratadine 10 MG CAPS Take  by mouth.       Marland Kitchen LIPITOR 80 MG tablet TAKE 1 TABLET BY MOUTH ONCE DAILY **LFTS AND LIPIDS DUE BEFORE REFILLS CAN BE GIVEN**  30 tablet  0   . furosemide (LASIX) 20 MG tablet Take 20 mg by mouth daily.       Marland Kitchen docusate sodium (COLACE) 100 MG capsule Take 100 mg by mouth 2 times daily.       . bacitracin-neomycin-polymyxin b-hydrocortisone 1 % ointment Apply  topically 2 times daily. Apply topically 2 times daily.       . Ascorbic Acid (VITAMIN C) 250 MG tablet Take 500 mg by mouth daily.       Marland Kitchen ibuprofen (ADVIL;MOTRIN) 200 MG tablet Take 200 mg by mouth every 6 hours as needed.       Marland Kitchen  losartan (COZAAR) 50 MG tablet Take 50 mg by mouth daily.         . tamsulosin (FLOMAX) 0.4 MG capsule Take 0.4 mg by mouth daily.         . metformin (GLUCOPHAGE) 1000 MG tablet Take 500 mg by mouth daily (with breakfast).       Marland Kitchen omeprazole (PRILOSEC) 20 MG capsule Take 20 mg by mouth 2 times daily.           BP Readings from Last 3 Encounters:   04/23/13 146/82   10/04/12 132/78   04/03/12 140/82    Pulse Readings from Last 3 Encounters:   04/23/13 76   10/04/12 56   04/03/12 80        Objective  VITAL SIGNS: BP 146/82  Pulse 76  Ht 5\' 8"  (1.727 m)  Wt 170 lb (77.111 kg)  BMI 25.85 kg/m2  GENERAL:  The patient is a pleasant male in no apparent distress.   HEENT:  Head is normocephalic.  NECK:  Supple, without increased jugular venous pressures.       LUNGS:  Lungs are clear.      HEART:   Rhythm is regular without appreciated murmur or rub.    ABDOMEN:  Soft with bowel sounds intact.     EXTREMITIES:  There is no clubbing, cyanosis or edema.      NEUROLOGICAL:  Cranial nerves II through XII are grossly intact.          Assessment  1.  Stable cardiovascular status    Patient was examined for:    ICD-9-CM   1. Other specified cardiac dysrhythmias(427.89) 427.89   2. Pacemaker V45.01       Plan  1.  Wellness visit in six months  and return to see me in a year.     2.  Continue current medications.      Judith Blonder. Chase Picket, M.D., Ph.D., F.A.C.C.  [facility] [place], [place].     Cc:   Verlin Dike  [address]  [place] [address]  [phone] phone  [phone] fax

## 2013-06-30 NOTE — Telephone Encounter (Signed)
The patient would have to go in the hospital to start a new anti-arrhythmic.  If he wants to do that, let me know and I will discuss with cardiologist.  Thanks, Larita Fife

## 2013-06-30 NOTE — Telephone Encounter (Signed)
Patient is on Norpace and it is costing too much.  The insurance is wanting to know what we want him to try.  Please advise.

## 2013-07-02 NOTE — Telephone Encounter (Signed)
Called and spoke to patients wife, she stated they did not want to have to go in the hospital and she would see if she can get it somewhere less expensive.

## 2013-07-04 NOTE — Telephone Encounter (Signed)
We received notice from the patient that the medication Norpace needs a PA. Forms have been filled out and sent to [facility].

## 2013-07-16 NOTE — Telephone Encounter (Signed)
We received a denial letter for Prior-Auth from [employer], on the medication Norpace.  I spoke to [place] since the medication is being denied patient will need a appt to come and discuss alternatives with Dr Chase Picket.  I called the patient to let him know.  I spoke to his wife, appt 08-06-13.

## 2013-08-05 NOTE — Telephone Encounter (Signed)
Patients Norpace was denied by [employer].  We are resubmitting the request for PA with new information.  Forms faxed.

## 2013-08-06 MED ORDER — DISOPYRAMIDE PHOSPHATE 150 MG PO CAPS
150 MG | ORAL_CAPSULE | Freq: Two times a day (BID) | ORAL | Status: DC
Start: 2013-08-06 — End: 2013-08-06

## 2013-08-06 MED ORDER — DISOPYRAMIDE PHOSPHATE 150 MG PO CAPS
150 MG | ORAL_CAPSULE | Freq: Two times a day (BID) | ORAL | Status: DC
Start: 2013-08-06 — End: 2013-12-18

## 2013-08-06 NOTE — Patient Instructions (Signed)
 Please remember to bring all of your medications bottles to your appointment so that we can update your medication list.  Include all medications you take (including prescriptions, over-the-counter, vitamins, and herbals).  We ask that you bring your original medication bottles, as it is difficult to identify medications based on color and shape alone.      We are committed to providing you with the best care possible.  If you receive a survey after visiting one of our offices, please take time to share your experience concerning your physician office visit. These surveys are confidential and no health information about you is shared. We are eager to improve for you and we are counting on your feedback to help make that happen.     Allergies:  Review of patient's allergies indicates no known allergies.

## 2013-08-12 NOTE — Telephone Encounter (Signed)
[facility] denied approval for Norpace.  I have filled out the appeal forms to try to get the medication covered.  Dr Chase Picket has signed them off, I have faxed them.  We received a call for [employer] [facility] the medication has been approved from 07-02-13 through 04-16-14.  Patients wife notified and she voiced understanding.

## 2013-08-15 NOTE — Progress Notes (Signed)
[facility] [place], [place].  [facility]  [address], [address], [place]  [address]  Phone: [phone]  Fax: [phone]  Office Visit:  08/06/2013    Mark Franklin DOB: Aug 20, 1928, Male, 80 y.o.     HPI:  Mark Franklin presents doing well except for the fact that we are having trouble getting his disopyramide for his atrial fibrillation.  There is no angina, no overt heart failure, no syncope, and no stroke-like problems.     Patient Active Problem List   Diagnosis Code   . CAD (coronary artery disease) 414.00   . Hyperlipidemia 272.4   . HYPERTENSION 401.9   . Diabetes mellitus 250.00   . PUD (peptic ulcer disease) 533.90   . 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 Pacemaker V45.01   . Other specified cardiac dysrhythmias(427.89) 427.89       Past Medical History   Diagnosis Date   . CAD (coronary artery disease)    . Hyperlipidemia      Cholesterol management per pcp.    . Hypertension    . Diabetes mellitus (HCC)    . PUD (peptic ulcer disease)    . Polyarticular arthritis    . Pacemaker 08/23/2011     generator replacement   . Other specified cardiac dysrhythmias(427.89)    . Syncope    . Myocardial ischemia      Lexiscan 2012       Past Surgical History   Procedure Laterality Date   . Cardiac pacemaker placement     . Cataract removal     . Carpal tunnel release     . Finger trigger release     . Cardiac catheterization  130865     Ef 60%   . Cardiac catheterization  020801   . Cardiac catheterization  0801   . Cardiac catheterization  784696     EF 50%       Family History   Problem Relation Age of Onset   . Coronary Art Dis Other      family history       History   Substance Use Topics   . Smoking status: Former Games developer   . Smokeless tobacco: Not on file   . Alcohol Use: Not on file       [Allergies/Contraindications:  Review of patient's allergies indicates no known allergies.]     Outpatient Prescriptions Marked as Taking for the 08/06/13 encounter (Office Visit) with Mark Clark, MD   Medication Sig Dispense Refill   . disopyramide (NORPACE) 150 MG capsule Take 1 capsule by mouth 2 times daily.  60 capsule  1   . aspirin 81 MG tablet Take 81 mg by mouth daily.       . Iron 66 MG TABS Take  by mouth.       . Loratadine 10 MG CAPS Take  by mouth.       Marland Kitchen LIPITOR 80 MG tablet TAKE 1 TABLET BY MOUTH ONCE DAILY **LFTS AND LIPIDS DUE BEFORE REFILLS CAN BE GIVEN**  30 tablet  0   . furosemide (LASIX) 20 MG tablet Take 20 mg by mouth daily.       Marland Kitchen docusate sodium (COLACE) 100 MG capsule Take 100 mg by mouth 2 times daily.       . Ascorbic Acid (VITAMIN C) 250 MG tablet Take 500 mg by mouth daily.       Marland Kitchen ibuprofen (ADVIL;MOTRIN) 200 MG  tablet Take 200 mg by mouth every 6 hours as needed.       Marland Kitchen losartan (COZAAR) 50 MG tablet Take 50 mg by mouth daily.         . tamsulosin (FLOMAX) 0.4 MG capsule Take 0.4 mg by mouth daily.         . metformin (GLUCOPHAGE) 1000 MG tablet Take 500 mg by mouth daily (with breakfast).       Marland Kitchen omeprazole (PRILOSEC) 20 MG capsule Take 20 mg by mouth 2 times daily.           BP Readings from Last 3 Encounters:   08/06/13 130/60   04/23/13 146/82   10/04/12 132/78    Pulse Readings from Last 3 Encounters:   08/06/13 66   04/23/13 76   10/04/12 56        Physical Exam:  General:  The patient is a pleasant male in no apparent distress.  Vital signs:   BP 130/60   Pulse 66   Ht 5\' 8"  (1.727 m)   Wt 177 lb (80.287 kg)   BMI 26.92 kg/m2     HEENT:  Head is normocephalic.  Neck:  Supple, without increased jugular venous pressures.       Lungs:  Lungs are clear.      Heart:  Rhythm is regular without appreciated murmur or rub.    Abdomen:  Soft with bowel sounds intact.     Extremities:  No clubbing, cyanosis or edema.      Neurological:  Cranial nerves II through XII are grossly intact.          Assessment:  1.  Stable cardiovascular status    Patient was examined for:    ICD-9-CM    1. Other specified cardiac dysrhythmias(427.89) 427.89    2. Pacemaker V45.01     3. Arrhythmia 427.9 disopyramide (NORPACE) 150 MG capsule     DISCONTINUED: disopyramide (NORPACE) 150 MG capsule       Plan:  1.  We will see him back in six months.    2.  We are to investigate the availability of his disopyramide; if not, we will put him in the hospital and switch him.        Mark Franklin. Mark Franklin, M.D., Ph.D., F.A.C.C.  [facility] [place], [place].     Cc:   Mark Franklin  [address]  [place] [address]  [phone] phone  [phone] fax

## 2013-10-22 NOTE — Patient Instructions (Addendum)
Decrease Cozaar (losartan) 25 mg to 1/2 tablet daily daily.  Continue current medications as prescribed.   Continue heart healthy and low sodium diet.  Exercise as tolerated.  Strive for 15 minutes of exercise most days of the week.    Blood pressure goal is 140/90 or less. If you are a diabetic, the goal is 130/80 or less.   Monitor your blood pressure at home.  Call the office in 2 weeks to report readings.  If the readings are consistently elevated, call the office at [phone] for adjustment of your blood pressure medication.  Continue to follow up with primary care provider for medical concerns.    If you have a history of elevated cholesterol levels, it is recommended that lab work be checked every 6 months.  Call with any problems, questions or concerns.    Follow up as scheduled with cardiology.  Patient educational material on heart health has been included in the after visit summary for your review   Always keep a current medication list.  Bring your medications to every office visit.   Darl Pikes will call you at home for 3 month phone pacemaker check.    Additional instructions:  A Healthy Heart: After Your Visit  Your Care Instructions     Heart disease occurs when a substance called plaque builds up in the vessels that supply oxygen-rich blood to your heart. This can narrow the blood vessels and reduce blood flow. A heart attack happens when blood flow is completely blocked. A high-fat diet, smoking, and other factors increase the risk of heart disease.  Your doctor has found that you have a chance of having heart disease. You can do lots of things to keep your heart healthy. It may not be easy, but you can change your diet, exercise more, and quit smoking. These steps really work to lower your chance of heart disease.  Follow-up care is a key part of your treatment and safety. Be sure to make and go to all appointments, and call your doctor if you are having problems. It's also a good idea to know your  test results and keep a list of the medicines you take.  How can you care for yourself at home?  Diet   Use less salt when you cook and eat. This helps lower your blood pressure. Taste food before salting. Add only a little salt when you think you need it. With time, your taste buds will adjust to less salt.   Eat fewer snack items, fast foods, canned soups, and other high-salt, high-fat, processed foods.   Read food labels and try to avoid saturated and trans fats. They increase your risk of heart disease by raising cholesterol levels.   Limit the amount of solid fat-butter, margarine, and shortening-you eat. Use olive, peanut, or canola oil when you cook. Bake, broil, and steam foods instead of frying them.   Eating fish can lower your risk for heart disease. Eat at least 2 servings of fish a week. Salmon, mackerel, herring, sardines, and chunk light tuna are very good choices. These fish contain omega-3 fatty acids.   Eat a variety of fruit and vegetables every day. Dark green, deep orange, red, or yellow fruits and vegetables are especially good for you. Examples include spinach, carrots, peaches, and berries.   Foods high in fiber can reduce your cholesterol and provide important vitamins and minerals. High-fiber foods include whole-grain cereals and breads, oatmeal, beans, brown rice, citrus fruits, and apples.  Limit drinks and foods with added sugar. These include candy, desserts, and soda pop.  Lifestyle changes   If your doctor recommends it, get more exercise. Walking is a good choice. Bit by bit, increase the amount you walk every day. Try for at least 30 minutes on most days of the week. You also may want to swim, bike, or do other activities.   Do not smoke. If you need help quitting, talk to your doctor about stop-smoking programs and medicines. These can increase your chances of quitting for good. Quitting smoking may be the most important step you can take to protect your heart. It is  never too late to quit. You will get health benefits right away.   Limit alcohol to 2 drinks a day for men and 1 drink a day for women. Too much alcohol can cause health problems.  Medicines   Take your medicines exactly as prescribed. Call your doctor if you think you are having a problem with your medicine.   If your doctor recommends aspirin, take the amount directed each day. Make sure you take aspirin and not another kind of pain reliever, such as acetaminophen (Tylenol). If you take ibuprofen (such as Advil or Motrin) for other problems, take aspirin at least 2 hours before taking ibuprofen.  When should you call for help?  Call 911 if you have symptoms of a heart attack. These may include:   You have chest pain or pressure. This may occur with:   Chest pain or pressure, or a strange feeling in the chest.   Sweating.   Shortness of breath.   Pain, pressure, or a strange feeling in the back, neck, jaw, or upper belly or in one or both shoulders or arms.   Lightheadedness or sudden weakness.   A fast or irregular heartbeat.  After you call 911, the operator may tell you to chew 1 adult-strength or 2 to 4 low-dose aspirin. Wait for an ambulance. Do not try to drive yourself.  Watch closely for changes in your health, and be sure to contact your doctor if:   Your symptoms are slowly getting worse.   You do not get better as expected.   Where can you learn more?   Go to [email or web address] and sign in to your MyChart account. Enter [phone] in the Search Health Information box to learn more about "A Healthy Heart: After Your Visit."    If you do not have an account, please click on the "Sign Up Now" link.      2006-2015 [employer]. Care instructions adapted under license by [facility]. This care instruction is for use with your licensed healthcare professional. If you have questions about a medical condition or this instruction, always ask your healthcare professional.  [employer]wise, Inc[employer] disclaims any warranty or liability for your use of this information.  Content Version: 10.5.422740; Current as of: June 06, 2013

## 2013-10-22 NOTE — Progress Notes (Signed)
[facility], [place].  [facility]  [address], [place]  [address]  [phone] office  [phone] fax      OFFICE VISIT:  10/22/2013    Mark Franklin - DOB: Aug 03, 1928    Reason For Visit:  Mark Franklin is a 80 y.o. male who is here for 6 Month Follow-Up    The patient presents today for cardiology follow up and pacemaker interrogation.  The patient's wife reports that he had one lightheaded episode recently when standing up.  The patient is on Flomax at this time.  The patient has not been regularly checking BP at home.      Subjective  Mark Franklin denies exertional chest pain, shortness of breath, orthopnea, paroxysmal nocturnal dyspnea, syncope, presyncope, sustained arrythmia, edema and fatigue.  The patient denies numbness or weakness to suggest cerebrovascular accident or transient ischemic attack.      Mark Franklin has the following history as recorded in EpicCare:    Patient Active Problem List   Diagnosis Code   . CAD (coronary artery disease) 414.00   . Hyperlipidemia 272.4   . HYPERTENSION 401.9   . Diabetes mellitus 250.00   . PUD (peptic ulcer disease) 533.90   . 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 Pacemaker V45.01   . Other specified cardiac dysrhythmias(427.89) 427.89     Past Medical History   Diagnosis Date   . CAD (coronary artery disease)    . Hyperlipidemia      Cholesterol management per pcp.    . Hypertension    . Diabetes mellitus (HCC)    . PUD (peptic ulcer disease)    . Polyarticular arthritis    . Pacemaker 08/23/2011     generator replacement   . Other specified cardiac dysrhythmias(427.89)    . Syncope    . Myocardial ischemia      Lexiscan 2012     Past Surgical History   Procedure Laterality Date   . Cardiac pacemaker placement     . Cataract removal     . Carpal tunnel release     . Finger trigger release     . Cardiac catheterization  130865     Ef 60%   . Cardiac catheterization  020801   . Cardiac catheterization  0801   . Cardiac  catheterization  784696     EF 50%     Family History   Problem Relation Age of Onset   . Coronary Art Dis Other      family history     History   Substance Use Topics   . Smoking status: Former Smoker -- 2.00 packs/day for 10 years     Types: Cigarettes     Quit date: 10/22/1960   . Smokeless tobacco: Never Used   . Alcohol Use: No      Current Outpatient Prescriptions   Medication Sig Dispense Refill   . atorvastatin (LIPITOR) 80 MG tablet Take 40 mg by mouth daily       . disopyramide (NORPACE) 150 MG capsule Take 1 capsule by mouth 2 times daily.  60 capsule  1   . aspirin 81 MG tablet Take 81 mg by mouth daily.       . Iron 66 MG TABS Take  by mouth.       . Loratadine 10 MG CAPS   Take by mouth daily        . furosemide (LASIX) 20 MG  tablet Take 20 mg by mouth daily.       Marland Kitchen docusate sodium (COLACE) 100 MG capsule Take 100 mg by mouth 2 times daily.       . bacitracin-neomycin-polymyxin b-hydrocortisone 1 % ointment Apply  topically 2 times daily. Apply topically 2 times daily.       . Ascorbic Acid (VITAMIN C) 250 MG tablet Take 500 mg by mouth daily.       Marland Kitchen ibuprofen (ADVIL;MOTRIN) 200 MG tablet Take 200 mg by mouth every 6 hours as needed.       Marland Kitchen losartan (COZAAR) 50 MG tablet   Take 25 mg by mouth daily        . tamsulosin (FLOMAX) 0.4 MG capsule Take 0.4 mg by mouth daily.         . metformin (GLUCOPHAGE) 1000 MG tablet Take 500 mg by mouth daily (with breakfast).       Marland Kitchen omeprazole (PRILOSEC) 20 MG capsule Take 20 mg by mouth 2 times daily.           No current facility-administered medications for this visit.     Allergies: Review of patient's allergies indicates no known allergies.    Review of Systems  Constitutional - no appetite change, or unexpected weight change. No fever, chills or diaphoresis.  No significant change in activity level or new onset of fatigue.   HEENT - no significant rhinorrhea or epistaxis. No tinnitus or significant hearing loss.   Eyes - no sudden vision change or amaurosis.  No corneal arcus, xantholasma, subconjunctival hemorrhage or discharge.  Respiratory - no significant wheezing, stridor, apnea or cough.  No dyspnea on exertion or shortness of air.  Cardiovascular - no exertional chest pain to suggest myocardial ischemia.  No orthopnea or PND.  No sensation of sustained arrythmia.  No occurrence of slow heart rate.  No palpitations.  No claudication.  No leg edema.  Gastrointestinal - no abdominal swelling or pain. No blood in stool. No severe constipation, diarrhea, nausea, or vomiting.   Genitourinary - no dysuria, frequency, or urgency. No flank pain or hematuria.   Musculoskeletal - no back pain or myalgia.  No problems with gait.  Extremities - no clubbing, cyanosis or edema.  Skin - no color change or rash.  No pallor.  No new surgical incision.   Neurologic - no speech difficulty, facial asymmetry or lateralizing weakness.  No seizures, presyncope or syncope.  Positive for one light-headed episode with standing.  Hematologic - no easy bruising or excessive bleeding.   Psychiatric - no severe anxiety or insomnia.  No confusion.   All other review of systems are negative.    Objective  Vital Signs - BP 90/50   Pulse 64   Ht 5\' 8"  (1.727 m)   Wt 176 lb (79.833 kg)   BMI 26.77 kg/m2     General - Mark Franklin is alert, cooperative, and pleasant.  Well groomed.  No acute distress.    Body habitus - Body mass index is 26.77 kg/(m^2).  HEENT - Head is normocephalic. No circumoral cyanosis.  Dentition is normal.  EYES -   Lids normal without ptosis.  No discharge, edema or subconjunctival hemorrhage.   Neck - Symmetrical without apparent mass or lymphadenopathy.   Respiratory - Normal respiratory effort without use of accessory muscles.  Ausculatation reveals vesicular breath sounds without crackles, wheezes, rub or rhonchi.    Cardiovascular - No jugular venous distention.  Auscultation reveals regular rate and rhythm.  No audible clicks, gallop or rub.  No murmur.  No lower extremity  varicosities.  No carotid bruits.    Abdominal -  No visible distention, mass or pulsations.  Extremities - No clubbing or cyanosis.  No statis dermatitis or ulcers. No edema.    Musculoskeletal -   No Osler's nodes.  No kyphosis or scoliosis.  Gait is even and regular without limp or shuffle. Ambulates without assistance.  Skin -  Warm and dry; no rash or pallor.   No new surgical wound.  Neurological - No focal neurological deficits.  Thought processes coherent.  No apparent tremor.   Oriented to person, place and time.    Psychiatric -  Appropriate affect and mood.     Assessment:    1. CAD (coronary artery disease)    2. Hyperlipidemia    3. Hypertension    4. Pacemaker    Pacemaker check showed adequate battery status @ 2.78 V.  Mode DDDR  AP-VP 88.4%  AS-VP 11.5%  Appropriate diagnostics noted.  Sustained arrythmia:  none.  Reprogramming for sensitivity and threshold testing.  No permanent changes indicated.  TTM 3 months.    BP Readings from Last 3 Encounters:   10/22/13 90/50   08/06/13 130/60   04/23/13 146/82    Pulse Readings from Last 3 Encounters:   10/22/13 64   08/06/13 66   04/23/13 76        Wt Readings from Last 3 Encounters:   10/22/13 176 lb (79.833 kg)   08/06/13 177 lb (80.287 kg)   04/23/13 170 lb (77.111 kg)     Plan  Rechecked BP standing at 90/50 - patient asymptomatic.  Decrease Cozaar (losartan) 25 mg to 1/2 tablet daily daily.  Continue current medications as prescribed.   Continue heart healthy and low sodium diet.  Exercise as tolerated.  Strive for 15 minutes of exercise most days of the week.    Blood pressure goal is 140/90 or less. If you are a diabetic, the goal is 130/80 or less.   Monitor your blood pressure at home.  Call the office in 2 weeks to report readings.  If the readings are consistently elevated, call the office at [phone] for adjustment of your blood pressure medication.  Continue to follow up with primary care provider for medical concerns.    If you have a  history of elevated cholesterol levels, it is recommended that lab work be checked every 6 months.  Call with any problems, questions or concerns.    Follow up as scheduled with cardiology.  Patient educational material on heart health has been included in the after visit summary for your review   Always keep a current medication list.  Bring your medications to every office visit.   Mark Franklin will call you at home for 3 month phone pacemaker check.    Additional instructions:  A Healthy Heart: After Your Visit  Your Care Instructions     Heart disease occurs when a substance called plaque builds up in the vessels that supply oxygen-rich blood to your heart. This can narrow the blood vessels and reduce blood flow. A heart attack happens when blood flow is completely blocked. A high-fat diet, smoking, and other factors increase the risk of heart disease.  Your doctor has found that you have a chance of having heart disease. You can do lots of things to keep your heart healthy. It may not be easy, but you can change your diet, exercise more,  and quit smoking. These steps really work to lower your chance of heart disease.  Follow-up care is a key part of your treatment and safety. Be sure to make and go to all appointments, and call your doctor if you are having problems. It's also a good idea to know your test results and keep a list of the medicines you take.  How can you care for yourself at home?  Diet   Use less salt when you cook and eat. This helps lower your blood pressure. Taste food before salting. Add only a little salt when you think you need it. With time, your taste buds will adjust to less salt.   Eat fewer snack items, fast foods, canned soups, and other high-salt, high-fat, processed foods.   Read food labels and try to avoid saturated and trans fats. They increase your risk of heart disease by raising cholesterol levels.   Limit the amount of solid fat-butter, margarine, and shortening-you eat. Use  olive, peanut, or canola oil when you cook. Bake, broil, and steam foods instead of frying them.   Eating fish can lower your risk for heart disease. Eat at least 2 servings of fish a week. Salmon, mackerel, herring, sardines, and chunk light tuna are very good choices. These fish contain omega-3 fatty acids.   Eat a variety of fruit and vegetables every day. Dark green, deep orange, red, or yellow fruits and vegetables are especially good for you. Examples include spinach, carrots, peaches, and berries.   Foods high in fiber can reduce your cholesterol and provide important vitamins and minerals. High-fiber foods include whole-grain cereals and breads, oatmeal, beans, brown rice, citrus fruits, and apples.   Limit drinks and foods with added sugar. These include candy, desserts, and soda pop.  Lifestyle changes   If your doctor recommends it, get more exercise. Walking is a good choice. Bit by bit, increase the amount you walk every day. Try for at least 30 minutes on most days of the week. You also may want to swim, bike, or do other activities.   Do not smoke. If you need help quitting, talk to your doctor about stop-smoking programs and medicines. These can increase your chances of quitting for good. Quitting smoking may be the most important step you can take to protect your heart. It is never too late to quit. You will get health benefits right away.   Limit alcohol to 2 drinks a day for men and 1 drink a day for women. Too much alcohol can cause health problems.  Medicines   Take your medicines exactly as prescribed. Call your doctor if you think you are having a problem with your medicine.   If your doctor recommends aspirin, take the amount directed each day. Make sure you take aspirin and not another kind of pain reliever, such as acetaminophen (Tylenol). If you take ibuprofen (such as Advil or Motrin) for other problems, take aspirin at least 2 hours before taking ibuprofen.  When should you call  for help?  Call 911 if you have symptoms of a heart attack. These may include:   You have chest pain or pressure. This may occur with:   Chest pain or pressure, or a strange feeling in the chest.   Sweating.   Shortness of breath.   Pain, pressure, or a strange feeling in the back, neck, jaw, or upper belly or in one or both shoulders or arms.   Lightheadedness or sudden weakness.   A fast  or irregular heartbeat.  After you call 911, the operator may tell you to chew 1 adult-strength or 2 to 4 low-dose aspirin. Wait for an ambulance. Do not try to drive yourself.  Watch closely for changes in your health, and be sure to contact your doctor if:   Your symptoms are slowly getting worse.   You do not get better as expected.   Where can you learn more?   Go to [email or web address] and sign in to your MyChart account. Enter [phone] in the Search Health Information box to learn more about "A Healthy Heart: After Your Visit."    If you do not have an account, please click on the "Sign Up Now" link.      2006-2015 [employer]. Care instructions adapted under license by [facility] This care instruction is for use with your licensed healthcare professional. If you have questions about a medical condition or this instruction, always ask your healthcare professional. Health[employer], Inc[employer] disclaims any warranty or liability for your use of this information.  Content Version: 10.5.422740; Current as of: June 06, 2013    Aloha Gell, APRN

## 2013-11-05 NOTE — Telephone Encounter (Signed)
BP readings X2 weeks, per instructions last OV:   10/23/13 BP 148/74  HR 89     10/24/13 BP 167/80  HR 85  10/25/13 BP 139/83  HR 71  10/26/13 BP 129/69 HR 74  10/27/13 BP 148/78 HR 74  10/28/13 BP 140/90 HR 79   10/28/13 BP 125/57  HR 79   10/28/13 BP 129/72   10/29/13 BP 140/79   10/30/13 BP 147/82  HR 66  10/31/13 BP 139/84 HR 88  11/01/13 BP 144/78 HR 71  11/02/13 BP140/77  HR 72  11/03/13 BP 143/80  HR 71  11/04/13 BP 143/72 HR 76  11/05/13 BP 150/83  HR 70

## 2013-11-05 NOTE — Telephone Encounter (Signed)
Per Larita Fife, APRN, informed wife, No change in meds recommended at this time.

## 2013-11-05 NOTE — Telephone Encounter (Signed)
Overall, looks good.  He is was having some light-headedness, wanted to make sure no low BP readings or slow heart rates.  No change in meds recommended at this time.  Aloha Gell, APRN

## 2013-12-18 ENCOUNTER — Encounter

## 2013-12-18 MED ORDER — DISOPYRAMIDE PHOSPHATE 150 MG PO CAPS
150 MG | ORAL_CAPSULE | Freq: Two times a day (BID) | ORAL | Status: DC
Start: 2013-12-18 — End: 2014-05-24

## 2014-04-22 ENCOUNTER — Ambulatory Visit
Admit: 2014-04-22 | Discharge: 2014-04-22 | Payer: MEDICARE | Attending: Cardiovascular Disease | Primary: Internal Medicine

## 2014-04-22 DIAGNOSIS — I2510 Atherosclerotic heart disease of native coronary artery without angina pectoris: Secondary | ICD-10-CM

## 2014-04-22 MED ORDER — LOSARTAN POTASSIUM 25 MG PO TABS
25 MG | ORAL_TABLET | Freq: Every day | ORAL | Status: DC
Start: 2014-04-22 — End: 2015-04-29

## 2014-04-22 MED ORDER — ATORVASTATIN CALCIUM 40 MG PO TABS
40 MG | ORAL_TABLET | Freq: Every day | ORAL | Status: DC
Start: 2014-04-22 — End: 2015-05-05

## 2014-04-22 NOTE — Patient Instructions (Addendum)
Send [facility] reading on your pacemaker home monitor on 07/22/14.       Please remember to bring all of your medications bottles to your appointment so that we can update your medication list.  Include all medications you take (including prescriptions, over-the-counter, vitamins, and herbals).  We ask that you bring your original medication bottles, as it is difficult to identify medications based on color and shape alone.      We are committed to providing you with the best care possible.  If you receive a survey after visiting one of our offices, please take time to share your experience concerning your physician office visit. These surveys are confidential and no health information about you is shared. We are eager to improve for you and we are counting on your feedback to help make that happen.     Allergies:  Review of patient's allergies indicates no known allergies.

## 2014-04-23 ENCOUNTER — Inpatient Hospital Stay: Admit: 2014-04-23 | Payer: MEDICARE | Primary: Internal Medicine

## 2014-04-23 DIAGNOSIS — I2510 Atherosclerotic heart disease of native coronary artery without angina pectoris: Secondary | ICD-10-CM

## 2014-04-23 LAB — CBC WITH AUTO DIFFERENTIAL
Basophils %: 0.3 % (ref 0.0–1.0)
Basophils Absolute: 0 10*3/uL (ref 0.00–0.20)
Eosinophils %: 4.3 % (ref 0.0–5.0)
Eosinophils Absolute: 0.3 10*3/uL (ref 0.00–0.60)
Hematocrit: 46.3 % (ref 42.0–52.0)
Hemoglobin: 15.2 g/dL (ref 14.0–18.0)
Lymphocytes %: 20.4 % (ref 20.0–40.0)
Lymphocytes Absolute: 1.6 10*3/uL (ref 1.1–4.5)
MCH: 31.2 pg — ABNORMAL HIGH (ref 27.0–31.0)
MCHC: 32.8 g/dL — ABNORMAL LOW (ref 33.0–37.0)
MCV: 95.1 fL — ABNORMAL HIGH (ref 80.0–94.0)
MPV: 10.1 fL (ref 7.4–10.4)
Monocytes %: 11 % — ABNORMAL HIGH (ref 0.0–10.0)
Monocytes Absolute: 0.9 10*3/uL (ref 0.00–0.90)
Neutrophils %: 64 % (ref 50.0–65.0)
Neutrophils Absolute: 5 10*3/uL (ref 1.5–7.5)
Platelets: 199 10*3/uL (ref 130–400)
RBC: 4.87 M/uL (ref 4.70–6.10)
RDW: 14 % (ref 11.5–14.5)
WBC: 7.8 10*3/uL (ref 4.8–10.8)

## 2014-04-23 LAB — COMPREHENSIVE METABOLIC PANEL
ALT: 25 U/L (ref 5–41)
AST: 24 U/L (ref 5–40)
Albumin: 4.2 g/dL (ref 3.5–5.2)
Alkaline Phosphatase: 86 U/L (ref 40–130)
Anion Gap: 19 mmol/L (ref 7–19)
BUN: 19 mg/dL (ref 8–23)
CO2: 25 mmol/L (ref 22–29)
Calcium: 10.3 mg/dL — ABNORMAL HIGH (ref 8.2–9.6)
Chloride: 100 mmol/L (ref 98–111)
Creatinine: 1.2 mg/dL — ABNORMAL HIGH (ref 0.6–0.9)
GFR Non-African American: 58 — AB (ref >60)
Globulin: 2.6 g/dL
Glucose: 96 mg/dL (ref 74–109)
Potassium: 4.7 mmol/L (ref 3.5–5.1)
Sodium: 144 mmol/L (ref 136–145)
Total Bilirubin: 0.4 mg/dL (ref 0.2–1.2)
Total Protein: 6.8 g/dL (ref 6.7–8.7)

## 2014-04-24 NOTE — Progress Notes (Signed)
The following was transcribed by Suella Broad, MT    [facility], [place].  [address], [place]  [address]    Office Visit:  04/22/2014    Mark Franklin DOB: 1929/01/02, Male, 81 y.o.     Chief Complaint   Patient presents with   . 1 Year Follow Up     Patient is here for a wellness visit and pacemaker check.  He denies any cardiac problems. Dr Lysle Pearl lipids.    . Coronary Artery Disease      HPI: Mark Franklin presents today for routine follow-up.  He is seen here for coronary artery disease, hypertension, and hyperlipidemia.  Mark Franklin presents doing well.  There is no angina, no overt heart failure, no syncope, and no stroke-like problems.     Patient Active Problem List   Diagnosis Code   . CAD (coronary artery disease) I25.10   . Hyperlipidemia E78.5   . HYPERTENSION I10   . Diabetes mellitus E11.9   . PUD (peptic ulcer disease) K27.9   . 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 Pacemaker Z95.0   . Other specified cardiac dysrhythmias(427.89) I49.8       Past Medical History   Diagnosis Date   . CAD (coronary artery disease)    . Hyperlipidemia      Cholesterol management per pcp.    . Hypertension    . Diabetes mellitus (HCC)    . PUD (peptic ulcer disease)    . Polyarticular arthritis    . Pacemaker 08/23/2011     generator replacement   . Other specified cardiac dysrhythmias(427.89)    . Syncope    . Myocardial ischemia      Lexiscan 2012       Past Surgical History   Procedure Laterality Date   . Cardiac pacemaker placement     . Cataract removal     . Carpal tunnel release     . Finger trigger release     . Cardiac catheterization  259563     Ef 60%   . Cardiac catheterization  020801   . Cardiac catheterization  0801   . Cardiac catheterization  875643     EF 50%       History   Substance Use Topics   . Smoking status: Former Smoker -- 2.00 packs/day for 10 years     Types: Cigarettes     Quit date: 10/22/1960   . Smokeless tobacco: Never  Used   . Alcohol Use: No       Family History   Problem Relation Age of Onset   . Coronary Art Dis Other      family history       [Allergies/Contraindications:  Review of patient's allergies indicates no known allergies.]     Outpatient Prescriptions Marked as Taking for the 04/22/14 encounter (Office Visit) with Krystal Clark, MD   Medication Sig Dispense Refill   . atorvastatin (LIPITOR) 40 MG tablet Take 1 tablet by mouth daily 90 tablet 3   . losartan (COZAAR) 25 MG tablet Take 1 tablet by mouth daily 90 tablet 3   . disopyramide (NORPACE) 150 MG capsule Take 1 capsule by mouth 2 times daily 180 capsule 3   . aspirin 81 MG tablet Take 81 mg by mouth daily.     . Iron 66 MG TABS Take  by mouth.     . Loratadine 10 MG  CAPS   Take by mouth daily      . furosemide (LASIX) 20 MG tablet Take 20 mg by mouth daily.     Marland Kitchen docusate sodium (COLACE) 100 MG capsule Take 100 mg by mouth 2 times daily.     . bacitracin-neomycin-polymyxin b-hydrocortisone 1 % ointment Apply  topically 2 times daily. Apply topically 2 times daily.     . Ascorbic Acid (VITAMIN C) 250 MG tablet Take 500 mg by mouth daily.     Marland Kitchen ibuprofen (ADVIL;MOTRIN) 200 MG tablet Take 200 mg by mouth every 6 hours as needed.     . tamsulosin (FLOMAX) 0.4 MG capsule Take 0.4 mg by mouth daily.       . metformin (GLUCOPHAGE) 1000 MG tablet Take 500 mg by mouth daily (with breakfast).     Marland Kitchen omeprazole (PRILOSEC) 20 MG capsule Take 20 mg by mouth 2 times daily.           Recent Results (from the past 168 hour(s))   CBC WITH AUTO DIFFERENTIAL    Collection Time     04/23/14  8:12 AM       Result Value Ref Range    WBC 7.8  4.8 - 10.8 K/uL    RBC 4.87  4.70 - 6.10 M/uL    Hemoglobin 15.2  14.0 - 18.0 g/dL    Hematocrit 66.0  63.0 - 52.0 %    MCV 95.1 (*) 80.0 - 94.0 fL    MCH 31.2 (*) 27.0 - 31.0 pg    MCHC 32.8 (*) 33.0 - 37.0 g/dL    RDW 16.0  10.9 - 32.3 %    Platelets 199  130 - 400 K/uL    MPV 10.1  7.4 - 10.4 fL    Neutrophils Relative 64.0  50.0 - 65.0 %     Lymphocytes Relative 20.4  20.0 - 40.0 %    Monocytes Relative 11.0 (*) 0.0 - 10.0 %    Eosinophils Relative Percent 4.3  0.0 - 5.0 %    Basophils Relative 0.3  0.0 - 1.0 %    Neutrophils Absolute 5.0  1.5 - 7.5 K/uL    Lymphocytes Absolute 1.6  1.1 - 4.5 K/uL    Monocytes Absolute 0.90  0.00 - 0.90 K/uL    Eosinophils Absolute 0.30  0.00 - 0.60 K/uL    Basophils Absolute 0.00  0.00 - 0.20 K/uL   COMPREHENSIVE METABOLIC PANEL    Collection Time     04/23/14  8:12 AM       Result Value Ref Range    Sodium 144  136 - 145 mmol/L    Potassium 4.7  3.5 - 5.1 mmol/L    Chloride 100  98 - 111 mmol/L    CO2 25  22 - 29 mmol/L    Anion Gap 19  7 - 19 mmol/L    Glucose 96  74 - 109 mg/dL    BUN 19  8 - 23 mg/dL    CREATININE 1.2 (*) 0.6 - 0.9 mg/dL    GFR Non-African American 58 (*) >60    Calcium 10.3 (*) 8.2 - 9.6 mg/dL    Total Protein 6.8  6.7 - 8.7 g/dL    Alb 4.2  3.5 - 5.2 g/dL    Total Bilirubin 0.4  0.2 - 1.2 mg/dL    Alkaline Phosphatase 86  40 - 130 U/L    ALT 25  5 - 41 U/L    AST 24  5 - 40 U/L    Globulin 2.6          Review of Systems  Constitutional:  Negative for fatigue.  No fever, chills, diaphoresis or activity change, appetite change or unexpected weight change.  HENT:  Negative for nosebleeds, facial swelling, rhinorrhea and neck stiffness.  RESPIRATORY:  Negative for shortness of breath.  No cough, wheezing or stridor.   CARDIOVASCULAR:  There is no angina, no overt heart failure, and no syncope.  GASTROINTESTINAL:   Negative for abdominal distention.  GENITOURINARY:  Negative for dysuria, urgency and frequency.  MUSCULOSKELETAL:   Negative for myalgia, arthralgia and gait problem.  SKIN:  Negative for color change, pallor, rash and wound.  NEUROLOGICAL:   Negative for dizziness, syncope and lightheadedness.    HEMATOLOGICAL:   Does not bruise or bleed easily.  PSYCHIATRIC/BEHAVIORAL:   No excessive anxiety or confusion.       Physical Examination  GENERAL:  Alert and oriented x3.  His short-term and  long-term memory intact.  Judgment intact.  Oriented to time, place and person.  No depression, anxiety or agitation.  VITAL SIGNS:  BP 162/90 mmHg  Pulse 74  Ht 5\' 6"  (1.676 m)  Wt 181 lb (82.101 kg)  BMI 29.23 kg/m2   HEAD:  Normocephalic without evidence of old or recent trauma.  EARS:  Tympanic membranes not visualized.    EYES:  Sclerae clear.  Conjunctivae pink.  EOMs intact.  Pupils equal and round.  NOSE:  Negative nasal discharge or epistaxis.  MOUTH:  Teeth, gums and palate normal.   THROAT:  No lesions on lips or buccal mucosa.  Tongue protrudes in midline and is well papillated.  NECK:  Supple without mass or JVD.  Carotid pulses 2+ to palpation bilaterally without bruit.  No thyromegaly noted.    CHEST:  Equal bilateral expansion.  RESPIRATORY:  Clear to auscultation and percussion.  Normal respiratory effort.    CARDIOVASCULAR:  The heart's rhythm is regular.  No cardiac murmur heard upon auscultation.      ABDOMEN:  Soft, nontender.  Bowel sounds are normal.  No hepatosplenomegaly or palpable mass.  EXTREMITIES:  Radial pulses palpable bilaterally.  No cyanosis, clubbing, or peripheral edema.    SKIN:  Warm and dry.     GASTROINTESTINAL:  Stool sample not pertinent.  MUSCULOSKELETAL:  Normal muscle strength and tone.  No atrophy or abnormalities.  SKIN:  Warm, dry, intact.  No dermatitis or ulcers.  NEUROLOGIC:  Cranial nerves II through XII are grossly intact.      Assessment  1.  Stable cardiovascular status.      ICD-10-CM ICD-9-CM    1. Coronary artery disease involving native coronary artery of native heart without angina pectoris I25.10 414.01 CBC      Comprehensive Metabolic Panel   2. Hyperlipidemia, unspecified hyperlipidemia E78.5 272.4 CBC      Comprehensive Metabolic Panel      Plan  1.  Wellness visit in six months.  Return to see me in a year.   2.  Continue same medications.        Judith Blonder. Chase Picket, M.D., Ph.D., F.A.C.C.  [facility]     cc (pcp): Thayer Ohm, APRN

## 2014-05-04 NOTE — Telephone Encounter (Signed)
Spoke with wife about stable labs, stated he may not be at his   next appointment because of snow.

## 2014-05-08 ENCOUNTER — Encounter: Attending: Nurse Practitioner | Primary: Internal Medicine

## 2014-05-18 MED ORDER — METFORMIN HCL 500 MG PO TABS
500 MG | ORAL_TABLET | Freq: Three times a day (TID) | ORAL | Status: AC
Start: 2014-05-18 — End: ?

## 2014-05-18 NOTE — Telephone Encounter (Signed)
Pt needing a refill on metformin.  Sent into [facility]

## 2014-05-20 NOTE — Telephone Encounter (Signed)
Pt's wife called in to let us know that they got a letter stating that the insurance will no longer cover the medication Disopyramide (Norpase) 150mg  as of Jan. 1 2015.  Said that they have had problems trying to get this medication filled at pharmacy's. It would cost them anywhere from $40.00 to $100.00 depending on the pharmacy.  The pharmacy sent a letter to Dr. Blanchie Serve office on 05/05/2014 regarding this issue.  On that letter it gives 4 other optional medications to use in the place of Disopyramide.  Pt has enough medication to get to the 10th of February.  Also the paper that was sent from the pharmacy was from [facility].  Pt says that they want the medication sent to [facility] instead since [facility] is not a Preferred pharmacy through the insurance. Confirmed that the [facility] in demographics is correct.

## 2014-05-24 ENCOUNTER — Encounter

## 2014-05-24 MED ORDER — DISOPYRAMIDE PHOSPHATE 150 MG PO CAPS
150 MG | ORAL_CAPSULE | Freq: Two times a day (BID) | ORAL | Status: DC
Start: 2014-05-24 — End: 2015-05-25

## 2014-05-25 ENCOUNTER — Encounter: Attending: Nurse Practitioner | Primary: Internal Medicine

## 2014-05-28 MED ORDER — OMEPRAZOLE 20 MG PO CPDR
20 MG | ORAL_CAPSULE | Freq: Every day | ORAL | Status: AC
Start: 2014-05-28 — End: ?

## 2014-05-28 NOTE — Telephone Encounter (Signed)
Pt called in needing a refill on omeprazole.  Sent in a 90 day script to [facility]

## 2014-07-14 ENCOUNTER — Encounter: Attending: Nurse Practitioner | Primary: Internal Medicine

## 2014-08-05 NOTE — Progress Notes (Signed)
Patients wife called to get instructions on how to set up the Carelink.  Reviewed set up instructions.  Stay on phone with patient until he was able to send.  At first he had pulled in the cord upside down and transmission was not sending.  Received the transmission   Reviewed results with patient and wife.  Battery voltage 2.78 estimated longevity 4.51yrs  >= 80% paced in both the A and V  Measured threshold and impedance within defined limits  No detected episodes  Rhythm AP VP  Patient has scheduled office visit on 10/20/14 and next carelink 01/21/15.

## 2014-08-06 NOTE — Progress Notes (Signed)
Reviewed Carelink transmission and nurse assessment.  No change in plan of care.  Follow up monitoring as scheduled.  Lynn Macyn Remmert, APRN

## 2014-09-28 ENCOUNTER — Encounter: Attending: Nurse Practitioner | Primary: Internal Medicine

## 2014-10-20 ENCOUNTER — Encounter: Attending: Family | Primary: Internal Medicine

## 2014-11-04 ENCOUNTER — Encounter: Attending: Family | Primary: Internal Medicine

## 2014-11-18 ENCOUNTER — Ambulatory Visit: Admit: 2014-11-18 | Discharge: 2014-11-18 | Payer: MEDICARE | Attending: Family | Primary: Internal Medicine

## 2014-11-18 DIAGNOSIS — I2510 Atherosclerotic heart disease of native coronary artery without angina pectoris: Secondary | ICD-10-CM

## 2014-11-18 NOTE — Patient Instructions (Addendum)
Continue current medications as prescribed.   Continue to follow up with primary care provider for medical concerns.    Call with any problems, questions or concerns.    Follow up as scheduled with cardiology.    Additional patient instructions:  Risk factors for heart disease or progression of the disease:  Cigarette use, high cholesterol, high blood pressure, obesity and diabetes.  Continue heart healthy and low sodium diet.  Exercise as tolerated.  Strive for 15 minutes of exercise most days of the week.    Blood pressure goal is 140/90 or less. If you are a diabetic, the goal is 130/80 or less.  You may be asked to keep a blood pressure log.  If so, call the office to report readings in 2 weeks at [phone].  If you are taking cholesterol lowering medications, it is recommended that lab work be checked annually.    Always keep a current medication list.  Bring your medications to every office visit.   The following educational material has been included in this after visit summary for your review: heart health.    Your next Carelink transmission from home is scheduled for 02/18/15.  The pacemaker nurse will call you once the report has been reviewed.   Transmission will be scheduled every 3 months unless instructed otherwise.    [email or web address]    For questions and support:  [phone] Monday-Friday 8 am to 5 pm CST.          Aloha Gell, APRN  A Healthy Heart: Care Instructions  Your Care Instructions     Heart disease occurs when a substance called plaque builds up in the vessels that supply oxygen-rich blood to your heart. This can narrow the blood vessels and reduce blood flow. A heart attack happens when blood flow is completely blocked. A high-fat diet, smoking, and other factors increase the risk of heart disease.  Your doctor has found that you have a chance of having heart disease. You can do lots of things to keep your heart healthy. It may not be easy, but you can change your diet, exercise  more, and quit smoking. These steps really work to lower your chance of heart disease.  Follow-up care is a key part of your treatment and safety. Be sure to make and go to all appointments, and call your doctor if you are having problems. It's also a good idea to know your test results and keep a list of the medicines you take.  How can you care for yourself at home?  Diet   Use less salt when you cook and eat. This helps lower your blood pressure. Taste food before salting. Add only a little salt when you think you need it. With time, your taste buds will adjust to less salt.   Eat fewer snack items, fast foods, canned soups, and other high-salt, high-fat, processed foods.   Read food labels and try to avoid saturated and trans fats. They increase your risk of heart disease by raising cholesterol levels.   Limit the amount of solid fat-butter, margarine, and shortening-you eat. Use olive, peanut, or canola oil when you cook. Bake, broil, and steam foods instead of frying them.   Eating fish can lower your risk for heart disease. Eat at least 2 servings of fish a week. Salmon, mackerel, herring, sardines, and chunk light tuna are very good choices. These fish contain omega-3 fatty acids.   Eat a variety of fruit and vegetables every day. Dark green,  deep orange, red, or yellow fruits and vegetables are especially good for you. Examples include spinach, carrots, peaches, and berries.   Foods high in fiber can reduce your cholesterol and provide important vitamins and minerals. High-fiber foods include whole-grain cereals and breads, oatmeal, beans, brown rice, citrus fruits, and apples.   Limit drinks and foods with added sugar. These include candy, desserts, and soda pop.  Lifestyle changes   If your doctor recommends it, get more exercise. Walking is a good choice. Bit by bit, increase the amount you walk every day. Try for at least 30 minutes on most days of the week. You also may want to swim, bike, or  do other activities.   Do not smoke. If you need help quitting, talk to your doctor about stop-smoking programs and medicines. These can increase your chances of quitting for good. Quitting smoking may be the most important step you can take to protect your heart. It is never too late to quit. You will get health benefits right away.   Limit alcohol to 2 drinks a day for men and 1 drink a day for women. Too much alcohol can cause health problems.  Medicines   Take your medicines exactly as prescribed. Call your doctor if you think you are having a problem with your medicine.   If your doctor recommends aspirin, take the amount directed each day. Make sure you take aspirin and not another kind of pain reliever, such as acetaminophen (Tylenol). If you take ibuprofen (such as Advil or Motrin) for other problems, take aspirin at least 2 hours before taking ibuprofen.  When should you call for help?  Call 911 if you have symptoms of a heart attack. These may include:   You have chest pain or pressure. This may occur with:   Chest pain or pressure, or a strange feeling in the chest.   Sweating.   Shortness of breath.   Pain, pressure, or a strange feeling in the back, neck, jaw, or upper belly or in one or both shoulders or arms.   Lightheadedness or sudden weakness.   A fast or irregular heartbeat.  After you call 911, the operator may tell you to chew 1 adult-strength or 2 to 4 low-dose aspirin. Wait for an ambulance. Do not try to drive yourself.  Watch closely for changes in your health, and be sure to contact your doctor if:   Your symptoms are slowly getting worse.   You do not get better as expected.   Where can you learn more?   Go to [email or web address] and sign in to your MyChart account. Enter [phone] in the Search Health Information box to learn more about "A Healthy Heart: Care Instructions."    If you do not have an account, please click on the "Sign Up Now" link.    2006-2016  [employer]. Care instructions adapted under license by [facility]. This care instruction is for use with your licensed healthcare professional. If you have questions about a medical condition or this instruction, always ask your healthcare professional. Healthwise, Incorporated disclaims any warranty or liability for your use of this information.  Content Version: 10.8.513193; Current as of: June 06, 2013

## 2014-11-18 NOTE — Progress Notes (Signed)
[facility], [place].  [facility]  [address], [address], [place]  [address]  [phone] office  [phone] fax      OFFICE VISIT:  11/18/2014    JOSTON PATTON - DOB: 04-Jul-1928    Reason For Visit:  Pranesh is a 81 y.o. male who is here for 6 Month Follow-Up - 6 month follow up and pacemaker check.     The patient presents today for cardiology follow up and pacemaker interrogation.  Overall, the patient is doing well from a cardiac standpoint without symptoms to suggest progression of coronary artery disease. Blood pressures well controlled on current medications.  The patient's PCP monitors cholesterol.    Subjective  Ubaid denies exertional chest pain, shortness of breath, orthopnea, paroxysmal nocturnal dyspnea, syncope, presyncope, sustained arrythmia, edema and fatigue.  The patient denies numbness or weakness to suggest cerebrovascular accident or transient ischemic attack.      Harle Battiest has the following history as recorded in EpicCare:    Patient Active Problem List   Diagnosis Code   . CAD (coronary artery disease) I25.10   . Hyperlipidemia E78.5   . Hypertension I10   . Diabetes mellitus (HCC) E11.9   . PUD (peptic ulcer disease) K27.9   . 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 Pacemaker Z95.0   . Other specified cardiac dysrhythmias(427.89) I49.8     Past Medical History   Diagnosis Date   . CAD (coronary artery disease)    . Diabetes mellitus (HCC)    . Hyperlipidemia      Cholesterol management per pcp.    . Hypertension    . Myocardial ischemia      Lexiscan 2012   . Other specified cardiac dysrhythmias(427.89)    . Pacemaker 08/23/2011     generator replacement   . Polyarticular arthritis    . PUD (peptic ulcer disease)    . Syncope      Past Surgical History   Procedure Laterality Date   . Cardiac pacemaker placement     . Cataract removal     . Carpal tunnel release     . Finger trigger release     . Cardiac catheterization  962952     Ef 60%   .  Cardiac catheterization  020801   . Cardiac catheterization  0801   . Cardiac catheterization  841324     EF 50%     Family History   Problem Relation Age of Onset   . Coronary Art Dis Other      family history     Social History   Substance Use Topics   . Smoking status: Former Smoker     Packs/day: 2.00     Years: 10.00     Types: Cigarettes     Quit date: 10/22/1960   . Smokeless tobacco: Never Used   . Alcohol use No      Current Outpatient Prescriptions   Medication Sig Dispense Refill   . Chlorpheniramine Maleate (ALLERGY PO) Take by mouth     . donepezil (ARICEPT) 10 MG tablet Take 10 mg by mouth nightly     . LORazepam (ATIVAN) 1 MG tablet Take 1 mg by mouth every 6 hours as needed for Anxiety     . Docusate Calcium (STOOL SOFTENER PO) Take by mouth     . omeprazole (PRILOSEC) 20 MG capsule Take 1 capsule by mouth Daily 90 capsule 3   .  disopyramide (NORPACE) 150 MG capsule Take 1 capsule by mouth 2 times daily 180 capsule 5   . metFORMIN (GLUCOPHAGE) 500 MG tablet Take 1 tablet by mouth 3 times daily 90 tablet 3   . atorvastatin (LIPITOR) 40 MG tablet Take 1 tablet by mouth daily 90 tablet 3   . losartan (COZAAR) 25 MG tablet Take 1 tablet by mouth daily 90 tablet 3   . aspirin 81 MG tablet Take 81 mg by mouth daily.     . Iron 66 MG TABS Take  by mouth.     . Loratadine 10 MG CAPS   Take by mouth daily      . furosemide (LASIX) 20 MG tablet Take 20 mg by mouth daily.     Marland Kitchen docusate sodium (COLACE) 100 MG capsule Take 100 mg by mouth 2 times daily.     . bacitracin-neomycin-polymyxin b-hydrocortisone 1 % ointment Apply  topically 2 times daily. Apply topically 2 times daily.     . Ascorbic Acid (VITAMIN C) 250 MG tablet Take 500 mg by mouth daily.     Marland Kitchen ibuprofen (ADVIL;MOTRIN) 200 MG tablet Take 200 mg by mouth every 6 hours as needed.     . tamsulosin (FLOMAX) 0.4 MG capsule Take 0.4 mg by mouth daily.         No current facility-administered medications for this visit.      Allergies: Review of patient's  allergies indicates no known allergies.    Review of Systems  Constitutional - no appetite change, or unexpected weight change. No fever, chills or diaphoresis.  No significant change in activity level or new onset of fatigue.   HEENT - no significant rhinorrhea or epistaxis. No tinnitus or significant hearing loss.   Eyes - no sudden vision change or amaurosis. No corneal arcus, xantholasma, subconjunctival hemorrhage or discharge.  Respiratory - no significant wheezing, stridor, apnea or cough.  No dyspnea on exertion or shortness of air.  Cardiovascular - no exertional chest pain to suggest myocardial ischemia.  No orthopnea or PND.  No sensation of sustained arrythmia.  No occurrence of slow heart rate.  No palpitations.  No claudication.  No leg edema.  Gastrointestinal - no abdominal swelling or pain. No blood in stool. No severe constipation, diarrhea, nausea, or vomiting.   Genitourinary - no dysuria, frequency, or urgency. No flank pain or hematuria.   Musculoskeletal - no back pain or myalgia.  No problems with gait. Ambulates with cane.  Extremities - no clubbing, cyanosis or edema.  Skin - no color change or rash.  No pallor.  No new surgical incision.   Neurologic - no speech difficulty, facial asymmetry or lateralizing weakness.  No seizures, presyncope or syncope.  No significant dizziness.  Hematologic - no easy bruising or excessive bleeding.   Psychiatric - no severe anxiety or insomnia.  No confusion.   All other review of systems are negative.    Objective  Vital Signs -   Visit Vitals   . BP 110/60 (Site: Left Arm, Position: Sitting, Cuff Size: Medium Adult)   . Pulse 70   . Resp 20   . Ht 5\' 6"  (1.676 m)   . Wt 170 lb (77.1 kg)   . BMI 27.44 kg/m2     General - Jailin is alert, cooperative, and pleasant.  Well groomed.  No acute distress.    Body habitus - Body mass index is 27.44 kg/(m^2).  HEENT - Head is normocephalic. No circumoral cyanosis.  Dentition is normal.  EYES -   Lids normal  without ptosis.  No discharge, edema or subconjunctival hemorrhage.   Neck - Symmetrical without apparent mass or lymphadenopathy.   Respiratory - Normal respiratory effort without use of accessory muscles.  Ausculatation reveals vesicular breath sounds without crackles, wheezes, rub or rhonchi.    Cardiovascular - No jugular venous distention.  Auscultation reveals regular rate and rhythm.  No audible clicks, gallop or rub.  No murmur.  No lower extremity varicosities.  No carotid bruits.    Abdominal -  No visible distention, mass or pulsations.  Extremities - No clubbing or cyanosis.  No statis dermatitis or ulcers. No edema.    Musculoskeletal -   No Osler's nodes.  No kyphosis or scoliosis.  Gait is even and regular without limp or shuffle. Ambulates with assistance of cane.  Skin -  Warm and dry; no rash or pallor.   No new surgical wound.  Neurological - No focal neurological deficits.  Thought processes coherent.  No apparent tremor.   Oriented to person, place and time.    Psychiatric -  Appropriate affect and mood.     Assessment:    1. Coronary artery disease involving native coronary artery of native heart without angina pectoris     2. Essential hypertension     3. Mixed hyperlipidemia     4. Pacemaker     Pacemaker check showed adequate battery status @ 2.77 V.  Mode: DDDR.  Lead impedances are stable.  Pacing:  AP-VP 79.3%.  Appropriate diagnostics and safety margins noted.  Sustained arrythmia:  NONE.  Reprogramming for sensitivity and threshold testing.  No permanent changes indicated.  Next Carelink remote transmission:  02/18/15.    BP Readings from Last 3 Encounters:   11/18/14 110/60   04/22/14 162/90   10/22/13 90/50    Pulse Readings from Last 3 Encounters:   11/18/14 70   04/22/14 74   10/22/13 64        Wt Readings from Last 3 Encounters:   11/18/14 170 lb (77.1 kg)   04/22/14 181 lb (82.1 kg)   10/22/13 176 lb (79.8 kg)     Plan  Previous cardiac history and records reviewed.  Continue  current medications as prescribed.   Continue to follow up with primary care provider for medical concerns.    Call with any problems, questions or concerns.    Follow up as scheduled with cardiology.    Additional patient instructions:  Risk factors for heart disease or progression of the disease:  Cigarette use, high cholesterol, high blood pressure, obesity and diabetes.  Continue heart healthy and low sodium diet.  Exercise as tolerated.  Strive for 15 minutes of exercise most days of the week.    Blood pressure goal is 140/90 or less. If you are a diabetic, the goal is 130/80 or less.  You may be asked to keep a blood pressure log.  If so, call the office to report readings in 2 weeks at [phone].  If you are taking cholesterol lowering medications, it is recommended that lab work be checked annually.    Always keep a current medication list.  Bring your medications to every office visit.   The following educational material has been included in this after visit summary for your review: heart health.    Your next Carelink transmission from home is scheduled for 02/18/15.  The pacemaker nurse will call you once the report has been reviewed.  Transmission will be scheduled every 3 months unless instructed otherwise.  [email or web address]  For questions and support:  [phone] Monday-Friday 8 am to 5 pm CST.    A Healthy Heart: Care Instructions  Your Care Instructions     Heart disease occurs when a substance called plaque builds up in the vessels that supply oxygen-rich blood to your heart. This can narrow the blood vessels and reduce blood flow. A heart attack happens when blood flow is completely blocked. A high-fat diet, smoking, and other factors increase the risk of heart disease.  Your doctor has found that you have a chance of having heart disease. You can do lots of things to keep your heart healthy. It may not be easy, but you can change your diet, exercise more, and quit smoking. These steps  really work to lower your chance of heart disease.  Follow-up care is a key part of your treatment and safety. Be sure to make and go to all appointments, and call your doctor if you are having problems. It's also a good idea to know your test results and keep a list of the medicines you take.  How can you care for yourself at home?  Diet   Use less salt when you cook and eat. This helps lower your blood pressure. Taste food before salting. Add only a little salt when you think you need it. With time, your taste buds will adjust to less salt.   Eat fewer snack items, fast foods, canned soups, and other high-salt, high-fat, processed foods.   Read food labels and try to avoid saturated and trans fats. They increase your risk of heart disease by raising cholesterol levels.   Limit the amount of solid fat-butter, margarine, and shortening-you eat. Use olive, peanut, or canola oil when you cook. Bake, broil, and steam foods instead of frying them.   Eating fish can lower your risk for heart disease. Eat at least 2 servings of fish a week. Salmon, mackerel, herring, sardines, and chunk light tuna are very good choices. These fish contain omega-3 fatty acids.   Eat a variety of fruit and vegetables every day. Dark green, deep orange, red, or yellow fruits and vegetables are especially good for you. Examples include spinach, carrots, peaches, and berries.   Foods high in fiber can reduce your cholesterol and provide important vitamins and minerals. High-fiber foods include whole-grain cereals and breads, oatmeal, beans, brown rice, citrus fruits, and apples.   Limit drinks and foods with added sugar. These include candy, desserts, and soda pop.  Lifestyle changes   If your doctor recommends it, get more exercise. Walking is a good choice. Bit by bit, increase the amount you walk every day. Try for at least 30 minutes on most days of the week. You also may want to swim, bike, or do other activities.   Do not  smoke. If you need help quitting, talk to your doctor about stop-smoking programs and medicines. These can increase your chances of quitting for good. Quitting smoking may be the most important step you can take to protect your heart. It is never too late to quit. You will get health benefits right away.   Limit alcohol to 2 drinks a day for men and 1 drink a day for women. Too much alcohol can cause health problems.  Medicines   Take your medicines exactly as prescribed. Call your doctor if you think you are having a problem with your medicine.   If your doctor  recommends aspirin, take the amount directed each day. Make sure you take aspirin and not another kind of pain reliever, such as acetaminophen (Tylenol). If you take ibuprofen (such as Advil or Motrin) for other problems, take aspirin at least 2 hours before taking ibuprofen.  When should you call for help?  Call 911 if you have symptoms of a heart attack. These may include:   You have chest pain or pressure. This may occur with:   Chest pain or pressure, or a strange feeling in the chest.   Sweating.   Shortness of breath.   Pain, pressure, or a strange feeling in the back, neck, jaw, or upper belly or in one or both shoulders or arms.   Lightheadedness or sudden weakness.   A fast or irregular heartbeat.  After you call 911, the operator may tell you to chew 1 adult-strength or 2 to 4 low-dose aspirin. Wait for an ambulance. Do not try to drive yourself.  Watch closely for changes in your health, and be sure to contact your doctor if:   Your symptoms are slowly getting worse.   You do not get better as expected.   Where can you learn more?   Go to [email or web address] and sign in to your MyChart account. Enter [phone] in the Search Health Information box to learn more about "A Healthy Heart: Care Instructions."    If you do not have an account, please click on the "Sign Up Now" link.    2006-2016 [employer], [employer]. Care  instructions adapted under license by [facility]. This care instruction is for use with your licensed healthcare professional. If you have questions about a medical condition or this instruction, always ask your healthcare professional. [employer], [employer] disclaims any warranty or liability for your use of this information.  Content Version: 10.8.513193; Current as of: June 06, 2013     Aloha Gell, APRN

## 2014-11-23 NOTE — Telephone Encounter (Signed)
Pt's wife called and wanted records pertaining to 2013 Colon surgery.  I didn't see any records for colon surgery.  Will you please check this out and call pt's wife?   They need records asap for insurance.

## 2014-11-26 NOTE — Telephone Encounter (Signed)
Did we look in TCC ?  Can you check and see ?  Thanks

## 2014-11-26 NOTE — Telephone Encounter (Signed)
2013 should not be in TCC.  I tried to open his TCC chart.  It will not open.  His wife is going to call [facility] and see if they have the records they need.

## 2015-01-01 DIAGNOSIS — S41112A Laceration without foreign body of left upper arm, initial encounter: Principal | ICD-10-CM

## 2015-01-01 NOTE — ED Notes (Signed)
Bed: 10  Expected date:   Expected time:   Means of arrival: [facility]  Comments:  EMS: fall 81 yr old.      Hart Rochester, RN  01/01/15 [phone]

## 2015-01-02 ENCOUNTER — Inpatient Hospital Stay
Admit: 2015-01-02 | Discharge: 2015-01-02 | Disposition: A | Discharge status: Home / Self Care | Payer: MEDICARE | Attending: Emergency Medicine

## 2015-01-02 ENCOUNTER — Emergency Department: Admit: 2015-01-02 | Payer: MEDICARE | Primary: Internal Medicine

## 2015-01-02 ENCOUNTER — Ambulatory Visit: Primary: Internal Medicine

## 2015-01-02 MED ORDER — CEPHALEXIN 250 MG PO CAPS
250 MG | ORAL_CAPSULE | Freq: Four times a day (QID) | ORAL | 0 refills | Status: DC
Start: 2015-01-02 — End: 2015-11-02

## 2015-01-02 MED ORDER — LIDOCAINE-EPINEPHRINE 1 %-1:100000 IJ SOLN
1 %-:00000 | INTRAMUSCULAR | Status: DC
Start: 2015-01-02 — End: 2015-01-02

## 2015-01-02 MED FILL — LIDOCAINE-EPINEPHRINE 1 %-1:100000 IJ SOLN: 1 %-:00000 | INTRAMUSCULAR | Qty: 20

## 2015-01-02 NOTE — ED Notes (Signed)
See paper charting during system downtime.      Lucky RathkeMatthew Leetta Hendriks, RN  01/02/15 [phone]

## 2015-01-02 NOTE — Discharge Instructions (Signed)
Cuts: Care Instructions  Your Care Instructions  A cut can happen anywhere on your body.  Stitches, staples, skin adhesives, or pieces of tape called Steri-Strips are sometimes used to keep the edges of a cut together and help it heal. Steri-Strips can be used by themselves or with stitches or staples.  Sometimes cuts are left open.  If the cut went deep and through the skin, the doctor may have closed the cut in two layers. A deeper layer of stitches brings the deep part of the cut together. These stitches will dissolve and don't need to be removed. The upper layer closure, which could be stitches, staples, Steri-Strips, or adhesive, is what you see on the cut.  A cut is often covered by a bandage.  The doctor has checked you carefully, but problems can develop later. If you notice any problems or new symptoms, get medical treatment right away.  Follow-up care is a key part of your treatment and safety. Be sure to make and go to all appointments, and call your doctor if you are having problems. It's also a good idea to know your test results and keep a list of the medicines you take.  How can you care for yourself at home?  If a cut is open or closed   Prop up the sore area on a pillow anytime you sit or lie down during the next 3 days. Try to keep it above the level of your heart. This will help reduce swelling.   Keep the cut dry for the first 24 to 48 hours. After this, you can shower if your doctor okays it. Pat the cut dry.   Don't soak the cut, such as in a bathtub. Your doctor will tell you when it's safe to get the cut wet.   After the first 24 to 48 hours, clean the cut with soap and water 2 times a day unless your doctor gives you different instructions.   Don't use hydrogen peroxide or alcohol, which can slow healing.   You may cover the cut with a thin layer of petroleum jelly and a nonstick bandage.   If the doctor put a bandage over the cut, put on a new bandage after cleaning the cut or if  the bandage gets wet or dirty.   Avoid any activity that could cause your cut to reopen.   Be safe with medicines. Read and follow all instructions on the label.   If the doctor gave you a prescription medicine for pain, take it as prescribed.   If you are not taking a prescription pain medicine, ask your doctor if you can take an over-the-counter medicine.  If the cut is closed with stitches, staples, or Steri-Strips   Follow the above instructions for open or closed cuts.   Do not remove the stitches or staples on your own. Your doctor will tell you when to come back to have the stitches or staples removed.   Leave Steri-Strips on until they fall off.  If the cut is closed with a skin adhesive   Follow the above instructions for open or closed cuts.   Leave the skin adhesive on your skin until it falls off on its own. This may take 5 to 10 days.   Do not scratch, rub, or pick at the adhesive.   Do not put the sticky part of a bandage directly on the adhesive.   Do not put any kind of ointment, cream, or lotion over the  area. This can make the adhesive fall off too soon. Do not use hydrogen peroxide or alcohol, which can slow healing.  When should you call for help?  Call your doctor now or seek immediate medical care if:   You have new pain, or your pain gets worse.   The skin near the cut is cold or pale or changes color.   You have tingling, weakness, or numbness near the cut.   The cut starts to bleed, and blood soaks through the bandage. Oozing small amounts of blood is normal.   You have trouble moving the area near the cut.   You have symptoms of infection, such as:   Increased pain, swelling, warmth, or redness around the cut.   Red streaks leading from the cut.   Pus draining from the cut.   A fever.  Watch closely for changes in your health, and be sure to contact your doctor if:   The cut reopens.   You do not get better as expected.  Where can you learn more?  Go to  [email or web address] and sign in to your MyChart account. Enter M735 in the Search Health Information box to learn more about "Cuts: Care Instructions."    If you do not have an account, please click on the "Sign Up Now" link.   2006-2016 [employer]. Care instructions adapted under license by [facility]. This care instruction is for use with your licensed healthcare professional. If you have questions about a medical condition or this instruction, always ask your healthcare professional. [employer], Inc[employer] disclaims any warranty or liability for your use of this information.  Content Version: 10.9.538570; Current as of: November 12, 2013

## 2015-01-02 NOTE — ED Provider Notes (Signed)
[facility] EMERGENCY DEPT  eMERGENCY dEPARTMENT eNCOUnter      Pt Name: Mark Franklin  MRN: [id number]  Birthdate [birth year]  Date of evaluation: 01/01/2015  Provider: Waunita Schooner, APRN    CHIEF COMPLAINT       Chief Complaint   Patient presents with   ??? Laceration     Pt states that he got up to to go to bathroom he was walking with his walker when he tripped he states that the laceration on his left arm is from a puncture, but not sure what punctured him. Pt states he bumped his head as well.          HISTORY OF PRESENT ILLNESS   (Location/Symptom, Timing/Onset, Context/Setting, Quality, Duration, Modifying Factors, Severity)  Note limiting factors.   Mark Franklin is a 81 y.o. male who presents to the emergency department by ambulance with a puncture wound to his left forearm.  Pt states he tripped over his walker and fell.  No loss of consciousness.  Pt has a steel rod in his left forearm from an old injury and surgery     Patient is a 81 y.o. male presenting with fall. The history is provided by the patient and the spouse.   Fall   The accident occurred 3 to 5 hours ago. The fall occurred while walking and while standing. He fell from a height of 3 to 5 ft. He landed on carpet. He was not ambulatory at the scene. Pertinent negatives include no numbness, no vomiting, no headaches and no loss of consciousness.       Nursing Notes were reviewed.    REVIEW OF SYSTEMS    (2-9 systems for level 4, 10 or more for level 5)     Review of Systems   Gastrointestinal: Negative for vomiting.   Skin: Positive for wound.   Neurological: Negative for loss of consciousness, numbness and headaches.       Except as noted above the remainder of the review of systems was reviewed and negative.       PAST MEDICAL HISTORY     Past Medical History   Diagnosis Date   ??? CAD (coronary artery disease)    ??? Diabetes mellitus (HCC)    ??? Hyperlipidemia      Cholesterol management per pcp.    ??? Hypertension    ??? Myocardial ischemia      Lexiscan 2012    ??? Other specified cardiac dysrhythmias(427.89)    ??? Pacemaker 08/23/2011     generator replacement   ??? Polyarticular arthritis    ??? PUD (peptic ulcer disease)    ??? Syncope          SURGICAL HISTORY       Past Surgical History   Procedure Laterality Date   ??? Cardiac pacemaker placement     ??? Cataract removal     ??? Carpal tunnel release     ??? Finger trigger release     ??? Cardiac catheterization  045409     Ef 60%   ??? Cardiac catheterization  020801   ??? Cardiac catheterization  0801   ??? Cardiac catheterization  811914     EF 50%         CURRENT MEDICATIONS       Discharge Medication List as of 01/02/2015  3:07 AM      CONTINUE these medications which have NOT CHANGED    Details   Chlorpheniramine Maleate (ALLERGY PO) Take by  mouth      donepezil (ARICEPT) 10 MG tablet Take 10 mg by mouth nightly      LORazepam (ATIVAN) 1 MG tablet Take 1 mg by mouth every 6 hours as needed for Anxiety      !! Docusate Calcium (STOOL SOFTENER PO) Take by mouth      omeprazole (PRILOSEC) 20 MG capsule Take 1 capsule by mouth Daily, Disp-90 capsule, R-3      disopyramide (NORPACE) 150 MG capsule Take 1 capsule by mouth 2 times daily, Disp-180 capsule, R-5      metFORMIN (GLUCOPHAGE) 500 MG tablet Take 1 tablet by mouth 3 times daily, Disp-90 tablet, R-3      atorvastatin (LIPITOR) 40 MG tablet Take 1 tablet by mouth daily, Disp-90 tablet, R-3      losartan (COZAAR) 25 MG tablet Take 1 tablet by mouth daily, Disp-90 tablet, R-3      aspirin 81 MG tablet Take 81 mg by mouth daily.      Iron 66 MG TABS Take  by mouth.      Loratadine 10 MG CAPS   Take by mouth daily       furosemide (LASIX) 20 MG tablet Take 20 mg by mouth daily.      !! docusate sodium (COLACE) 100 MG capsule Take 100 mg by mouth 2 times daily.      bacitracin-neomycin-polymyxin b-hydrocortisone 1 % ointment Apply  topically 2 times daily. Apply topically 2 times daily., Topical, Historical Med      Ascorbic Acid (VITAMIN C) 250 MG tablet Take 500 mg by mouth daily.       ibuprofen (ADVIL;MOTRIN) 200 MG tablet Take 200 mg by mouth every 6 hours as needed.      tamsulosin (FLOMAX) 0.4 MG capsule Take 0.4 mg by mouth daily.         !! - Potential duplicate medications found. Please discuss with provider.          ALLERGIES     Review of patient's allergies indicates no known allergies.    FAMILY HISTORY       Family History   Problem Relation Age of Onset   ??? Coronary Art Dis Other      family history          SOCIAL HISTORY       Social History     Social History   ??? Marital status: Married     Spouse name: N/A   ??? Number of children: N/A   ??? Years of education: N/A     Social History Main Topics   ??? Smoking status: Former Smoker     Packs/day: 2.00     Years: 10.00     Types: Cigarettes     Quit date: 10/22/1960   ??? Smokeless tobacco: Never Used   ??? Alcohol use No   ??? Drug use: No   ??? Sexual activity: Not Asked     Other Topics Concern   ??? None     Social History Narrative       SCREENINGS             PHYSICAL EXAM    (up to 7 for level 4, 8 or more for level 5)   ED Triage Vitals   BP Temp Temp src Pulse Resp SpO2 Height Weight   01/01/15 2257 -- -- 01/01/15 2257 01/01/15 2257 01/01/15 2257 -- 01/01/15 2257   129/84   72 18 100 %  140 lb (  63.5 kg)       Physical Exam   Constitutional: He appears well-developed and well-nourished.   HENT:   Head: Normocephalic and atraumatic.   Eyes: Right eye exhibits no discharge. Left eye exhibits no discharge. No scleral icterus.   Neck: Normal range of motion. Neck supple.   Cardiovascular: Normal rate, regular rhythm and normal heart sounds.    Pulmonary/Chest: Effort normal and breath sounds normal. No respiratory distress.   Neurological: He is alert.   Skin: Skin is warm and dry.        Psychiatric: He has a normal mood and affect. His behavior is normal.   Nursing note and vitals reviewed.      DIAGNOSTIC RESULTS     EKG: All EKG's are interpreted by the Emergency Department Physician who either signs or Co-signs this chart in the absence  of a cardiologist.        RADIOLOGY:   Non-plain film images such as CT, Ultrasound and MRI are read by the radiologist. Plain radiographic images are visualized and preliminarily interpreted by the emergency physician with the below findings:      Interpretation per the Radiologist below, if available at the time of this note:    XR Radius Ulna Left Standard   Final Result   1. Proximal soft tissue injury without evidence of acute bony injury.   2. Posttraumatic and degenerative changes in the LEFT forearm, wrist   and elbow.      Dictated on 01/02/2015 12:34 PM EST. Signed by Dr Barrie Dunker on   01/02/2015 12:34 PM EST   Signed by Dr Reuel Boom Riherd  on 01/02/2015 11:34            ED BEDSIDE ULTRASOUND:   Performed by ED Physician - none    LABS:  Labs Reviewed - No data to display    All other labs were within normal range or not returned as of this dictation.    EMERGENCY DEPARTMENT COURSE and DIFFERENTIAL DIAGNOSIS/MDM:   Vitals:    Vitals:    01/01/15 2257 01/02/15 0429   BP: 129/84 140/64   Pulse: 72 67   Resp: 18 20   Temp:  98.2 ??F (36.8 ??C)   TempSrc:  Tympanic   SpO2: 100%    Weight: 140 lb (63.5 kg)            MDM      CONSULTS:  None    PROCEDURES:  Unless otherwise noted below, none     Lac Repair  Date/Time: 01/03/2015 12:05 AM  Performed by: Waunita Schooner  Authorized by: Sandria Senter C     Consent:     Consent obtained:  Verbal    Consent given by:  Patient    Risks discussed:  Infection  Anesthesia (see MAR for exact dosages):     Anesthesia method:  Local infiltration    Local anesthetic:  Lidocaine 1% WITH epi  Laceration details:     Location:  Shoulder/arm    Length (cm):  3    Depth (mm):  3  Repair type:     Repair type:  Simple  Pre-procedure details:     Preparation:  Patient was prepped and draped in usual sterile fashion  Exploration:     Hemostasis achieved with:  Direct pressure    Wound extent: areolar tissue violated and fascia violated      Wound extent: no foreign bodies/material  noted, no muscle damage noted, no nerve damage  noted, no tendon damage noted and no underlying fracture noted      Contaminated: yes    Treatment:     Area cleansed with:  Betadine and saline    Amount of cleaning:  Extensive    Irrigation volume:  50    Irrigation method:  Syringe    Visualized foreign bodies/material removed: no    Skin repair:     Repair method:  Sutures    Suture size:  5-0    Suture material:  Nylon    Suture technique:  Simple interrupted    Number of sutures:  5  Approximation:     Approximation:  Loose  Post-procedure details:     Dressing:  Non-adherent dressing, sterile dressing and bulky dressing    Patient tolerance of procedure:  Tolerated well, no immediate complications        FINAL IMPRESSION      1. Fall from other slipping, tripping, or stumbling    2. Laceration of upper limb, left, initial encounter          DISPOSITION/PLAN   DISPOSITION Decision to Discharge    PATIENT REFERRED TO:  Belinda Block, MD  [address]  [place] [address]  [phone]    Schedule an appointment as soon as possible for a visit  As needed    [facility] EMERGENCY DEPT  [address]  [place] [address]  [phone]  In 10 days  For suture removal        In 1 day  For wound re-check      DISCHARGE MEDICATIONS:  Discharge Medication List as of 01/02/2015  3:07 AM      START taking these medications    Details   cephALEXin (KEFLEX) 250 MG capsule Take 1 capsule by mouth 4 times daily, Disp-28 capsule, R-0                (Please note that portions of this note were completed with a voice recognition program.  Efforts were made to edit the dictations but occasionally words are mis-transcribed.)    Waunita Schooner, APRN (electronically signed)          Waunita Schooner, APRN  01/03/15 [phone]

## 2015-01-15 NOTE — Telephone Encounter (Signed)
Patients wife called to verify when patients next pacemaker check was due.  She had it listed for 01/18/15.  Told her patient was just seen in office on 11/18/14 and his next pacemaker check is scheduled for 02/18/15 with carelink home monitor.  She stated she must have written that down wrong.  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  She has a paper with the requested information she needs.  She stated they were going to come by the office this afternoon.  Informed her patient will need to sign a medical records release the a copy of the request can be made and sent to medical records to fulfil their request

## 2015-02-19 NOTE — Telephone Encounter (Signed)
Patient wife states that the pharmacy will not fill this patient's Rx. She was looking for her notes concerning this and as we were on the phone she could not find them. Patient wife states that she will call back once she get's everything together and finds what she is looking for. I voiced ok.

## 2015-02-22 NOTE — Progress Notes (Signed)
Carelink remote pacemaker interrogation  Presenting rhythm: AP-VP, AP 75.2%, VP 100%  Battery status: 2.77 V  Lead status: lead impedance within range and stable  Sensing: P waves 1.4 to 2.8 mV,  R waves >80% paced  Thresholds:  Atrial 1.500 V @ 0.56ms, Ventricular 1.750 V @ 0.65ms  Observations:  none  Next office visit:  04/28/15  Next Carelink home check scheduled for 07/27/15  Mrs Chismar notified.

## 2015-02-22 NOTE — Progress Notes (Signed)
Reviewed Carelink transmission and nurse assessment.  No change in plan of care.  Follow up monitoring as scheduled.  Lynn Lakeyia Surber, APRN

## 2015-04-28 ENCOUNTER — Ambulatory Visit
Admit: 2015-04-28 | Discharge: 2015-04-28 | Payer: MEDICARE | Attending: Cardiovascular Disease | Primary: Internal Medicine

## 2015-04-28 DIAGNOSIS — I442 Atrioventricular block, complete: Secondary | ICD-10-CM

## 2015-04-28 NOTE — Progress Notes (Signed)
[facility]  [facility] [address], [address], [place]  [address]  The following was transcribed by Suella Broad, M.T.     Mark Franklin DOB: 1929/04/01, 82 y.o. Male        Office Visit:  04/28/2015    Chief Complaint   Patient presents with   . 1 Year Follow Up     Wellness visit.  Pacemaker check due today.  He and his wife will possibly move to [place].     . Coronary Artery Disease      HISTORY OF PRESENT ILLNESS  Mark Franklin is seen here for coronary artery disease, hypertension, and pacemaker.  This is a routine follow up.  Mark Franklin presents presents today without angina, overt heart failure, or syncope.  His pacemaker has been checked and appears to be working with appropriate sensing and capture.       Patient Active Problem List   Diagnosis Code   . CAD (coronary artery disease) I25.10   . Hyperlipidemia E78.5   . Hypertension I10   . Diabetes mellitus (HCC) E11.9   . PUD (peptic ulcer disease) K27.9   . 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 Pacemaker Z95.0   . Other specified cardiac dysrhythmias I49.8   . Complete heart block (HCC) I44.2     Past Medical History   Diagnosis Date   . CAD (coronary artery disease)    . Diabetes mellitus (HCC)    . Hyperlipidemia      Cholesterol management per pcp.    . Hypertension    . Myocardial ischemia      Lexiscan 2012   . Other specified cardiac dysrhythmias(427.89)    . Pacemaker 08/23/2011     generator replacement   . Polyarticular arthritis    . PUD (peptic ulcer disease)    . Syncope      Past Surgical History   Procedure Laterality Date   . Cardiac pacemaker placement     . Cataract removal     . Carpal tunnel release     . Finger trigger release     . Cardiac catheterization  956213     Ef 60%   . Cardiac catheterization  020801   . Cardiac catheterization  0801   . Cardiac catheterization  086578     EF 50%     Social History   Substance Use Topics   . Smoking status: Former Smoker     Packs/day: 2.00     Years: 10.00      Types: Cigarettes     Quit date: 10/22/1960   . Smokeless tobacco: Never Used   . Alcohol use No      Family History   Problem Relation Age of Onset   . Coronary Art Dis Other      family history     Allergies/Contraindications:  Review of patient's allergies indicates no known allergies.     Outpatient Prescriptions Marked as Taking for the 04/28/15 encounter (Office Visit) with Krystal Clark, MD   Medication Sig Dispense Refill   . losartan (COZAAR) 25 MG tablet TAKE 1 TABLET BY MOUTH DAILY 90 tablet 3   . memantine (NAMENDA) 10 MG tablet      . Chlorpheniramine Maleate (ALLERGY PO) Take by mouth     . donepezil (ARICEPT) 10 MG tablet Take 10 mg by mouth nightly     . LORazepam (ATIVAN) 1 MG tablet Take 1 mg by mouth  every 6 hours as needed for Anxiety     . Docusate Calcium (STOOL SOFTENER PO) Take by mouth     . omeprazole (PRILOSEC) 20 MG capsule Take 1 capsule by mouth Daily 90 capsule 3   . disopyramide (NORPACE) 150 MG capsule Take 1 capsule by mouth 2 times daily 180 capsule 5   . metFORMIN (GLUCOPHAGE) 500 MG tablet Take 1 tablet by mouth 3 times daily 90 tablet 3   . atorvastatin (LIPITOR) 40 MG tablet Take 1 tablet by mouth daily 90 tablet 3   . [DISCONTINUED] losartan (COZAAR) 25 MG tablet Take 1 tablet by mouth daily 90 tablet 3   . aspirin 81 MG tablet Take 81 mg by mouth daily.     . Iron 66 MG TABS Take  by mouth.     . Loratadine 10 MG CAPS   Take by mouth daily      . furosemide (LASIX) 20 MG tablet Take 20 mg by mouth daily.     Marland Kitchen docusate sodium (COLACE) 100 MG capsule Take 100 mg by mouth 2 times daily.     . bacitracin-neomycin-polymyxin b-hydrocortisone 1 % ointment Apply  topically 2 times daily. Apply topically 2 times daily.     . Ascorbic Acid (VITAMIN C) 250 MG tablet Take 500 mg by mouth daily.     Marland Kitchen ibuprofen (ADVIL;MOTRIN) 200 MG tablet Take 200 mg by mouth every 6 hours as needed.     . tamsulosin (FLOMAX) 0.4 MG capsule Take 0.4 mg by mouth daily.         Data:  BP Readings from  Last 3 Encounters:   04/28/15 (!) 92/52   01/02/15 140/64   11/18/14 110/60    Pulse Readings from Last 3 Encounters:   04/28/15 75   01/02/15 67   11/18/14 70        Pacemaker interrogated today, 04/28/2015  Presenting rhythm: AS VP, AP 79.6%, VP 100%  Battey voltage 2.78 V  Lead status: Lead impedance within range and stable  Sensing: P waves 1-1.4 mV, R waves paced at 40  Thresholds: Atrial 1.0V @ 0.82ms, ventricular 1.5@ 0.80ms  Observations: 1 mode switch episode duration <  Reprogramming for sensitivity and threshold testing  Next carelink appointment: 07/28/15    REVIEW OF SYSTEMS  Constitutional:  At this time, the patient denies fevers, chills, night sweats, or weight change.  HENT:  Negative for nosebleeds, facial swelling, rhinorrhea and neck stiffness.  RESPIRATORY:  No shortness of breath, cough or sputum production.  No wheezing or stridor.   CARDIOVASCULAR:  There is no angina, no overt heart failure, and no syncope.  GASTROINTESTINAL:   Negative for abdominal distention.  GENITOURINARY:  Negative for dysuria, urgency and frequency.  MUSCULOSKELETAL:   Negative for myalgia, arthralgia and gait problem.  SKIN:  Negative for color change, pallor, rash and wound.  NEUROLOGICAL:   Negative for dizziness, syncope and lightheadedness.    HEMATOLOGICAL:   Negative for bruising and bleeding easily.  PSYCHIATRIC/BEHAVIORAL:   No excessive anxiety or confusion.   All other review of systems are negative, except as noted in HPI.       PHYSICAL EXAMINATION  GENERAL:  Alert and oriented x3 in no apparent distress.  Short-term and long-term memory intact.  Judgment intact.  Oriented to time, place and person.  No depression, anxiety or agitation.  Vital Signs:  BP (!) 92/52  Pulse 75  Wt 178 lb (80.7 kg)  BMI 28.73 kg/m2  HEAD:  Normocephalic without evidence of old or recent trauma.  EYES:  Sclerae clear.  Conjunctivae pink. Pupils equal and round.  NOSE:  Negative nasal discharge or epistaxis.  THROAT:  No  lesions on lips or buccal mucosa.    NECK:  Supple without mass or JVD.  Carotid pulses 2+ to palpation bilaterally without bruit.  No thyromegaly noted.    CHEST:  Equal bilateral expansion.  RESPIRATORY:  The lungs are clear to auscultation.  Normal respiratory effort.    CARDIOVASCULAR:  The heart's rhythm is regular without appreciable murmur, gallop or rub.      ABDOMEN:  Soft, nontender.  Exhibits no distension.  Bowel sounds are normal.   UPPER EXTREMITY EVALUATION:  Radial pulses palpable bilaterally.  No cyanosis, clubbing or edema.   LOWER EXTREMITY EVALUATION:  Femoral, popliteal, dorsalis pedis, and posterior tibialis pulses 2+ to palpation bilaterally.  No cyanosis, clubbing, or peripheral edema.    SKIN:  Warm and dry.     MUSCULOSKELETAL:  Normal muscle strength and tone.   SKIN:  Warm, dry.  NEUROLOGIC:  Cranial nerves II through XII are grossly intact.      IMPRESSION / PLAN  1.  Coronary artery disease without angina.   2.  Hypertension which is well controlled.    3.  Pacemaker which is working well.    4.  Continue medications as prescribed.    5.  Wellness visit in six months and return to see me in a year.     Judith Blonder. Chase Picket, M.D., Ph.D., F.A.C.C.  [facility]     cc (pcp): Belinda Block, MD

## 2015-04-28 NOTE — Progress Notes (Signed)
Pacemaker interrogated  Presenting rhythm:  AS VP, AP 79.6%, VP 100%  Battey voltage 2.78 V  Lead status:  Lead impedance within range and stable  Sensing:  P waves 1-1.4 mV,  R waves paced at 40  Thresholds:  Atrial 1.0V @ 0.50ms, ventricular 1.5@ 0.31ms  Observations:  1 mode switch episode duration <  Reprogramming for sensitivity and threshold testing  Next carelink appointment:  07/28/15

## 2015-04-28 NOTE — Patient Instructions (Addendum)
Your next Carelink home pacemaker check is scheduled for 07/28/15.

## 2015-04-29 MED ORDER — LOSARTAN POTASSIUM 25 MG PO TABS
25 MG | ORAL_TABLET | ORAL | 3 refills | Status: AC
Start: 2015-04-29 — End: ?

## 2015-05-06 MED ORDER — ATORVASTATIN CALCIUM 40 MG PO TABS
40 MG | ORAL_TABLET | ORAL | 0 refills | Status: DC
Start: 2015-05-06 — End: 2015-08-23

## 2015-05-25 MED ORDER — DISOPYRAMIDE PHOSPHATE 150 MG PO CAPS
150 MG | ORAL_CAPSULE | ORAL | 3 refills | Status: DC
Start: 2015-05-25 — End: 2015-06-09

## 2015-05-31 NOTE — Telephone Encounter (Signed)
Patient medication Disopyramide 150 mg is $138.00, requesting that "whatever was done last time" be repeated so that medication price is lowered, paid $27.67 last time.

## 2015-06-07 NOTE — Telephone Encounter (Signed)
Patient spouse called stating the patient is out of disopyramide and can't pick it up at the pharmacy due to the cost being too much.  Spouse states she picked up a weeks worth due to the cost, but the patient is now out of the medication again.  Spouse states that something needs signed in order for the insurance to cover the medication and that this happens each time the patient runs out of the medication.  It sounds like this might be a PA that is needing done, the spouse was not real sure.  Explained I would give you the message to see where this process is.  Spouse states she called last Monday 05/31/15.

## 2015-06-08 NOTE — Telephone Encounter (Signed)
Called the patient [facility] and spoke with [place]. I explained to [place] the cost of the medication for the patient and asked if there was indeed a PA that needs to be submitted each time the patient comes up on another year. Selena Batten searched the patient info. And stated that it looks as if the cost of the medication itself has gone up and the insurance is now paying $100 and the co-pay for the patient is $138.78. Selena Batten suggested that I call the patient insurance and speak with them about the cost to see if there is anything that can be done to help. Number to call is [phone]. I voiced ok thank you.     Called [employer] PA help desk. Spoke with Ivin Booty who reviewed the patient information and verified that the medication cost is $138.78 due to the type of plan that the patient has signed up for. Ivin Booty states that he will fax over an application for lower tier cost that may potentially get the patient cost dropped. I asked Ivin Booty if the patient needs to change his plan and he stated no. Ivin Booty voiced that he would fax that form over right now. I asked Ivin Booty if there was anything else that can be done to help lower the cost in case the lower tier application was not sufficient enough and Ivin Booty voiced that he did not have the answer that question. I voiced understanding.

## 2015-06-08 NOTE — Telephone Encounter (Signed)
Mark Franklin called the office back stating that she spoke with someone from her insurance company who explained to her that the drug company itself has increased the price of the medication and that she may want to discuss with the doctor about an alternate therapy. Mark Franklin states that she would like to go ahead and pursue that idea, because she cannot afford to keep going through this with her husband medicine and they cannot afford the cost. I voiced to Mark Franklin that I would get with Dr. Chase Picket nurse to see what we can do. Mark Franklin voiced thank you and ok.

## 2015-06-08 NOTE — Telephone Encounter (Signed)
Patient wife Maureen Ralphs called requesting updated information on the patient Rx Disopyramide. Maureen Ralphs states that there has to be a PA done every year for some reason. i explained to vivian that I am currently in the process of working on this for the patient and I will return a call to her later today with an update. Maureen Ralphs voiced that would be fine, and she took down my name. Maureen Ralphs voiced that she is about to take the patient to his Dr. Appointment and will be home around 1 pm. I voiced ok.

## 2015-06-08 NOTE — Telephone Encounter (Signed)
Revealed the information to the patient wife Mark Franklin, she voiced that she understood. I suggested to Mark Franklin that she call the insurance company and speak with them about her plan to see what's going on and in the meantime I can submit the lower tier cost application. Mark Franklin voiced ok. I also suggested that she call me back and let me know what she has decided to do whether switch her plan or move forward with the lower cost application. Mark Franklin voiced thank you so very much and ok.

## 2015-06-09 MED ORDER — DISOPYRAMIDE PHOSPHATE 150 MG PO CAPS
150 MG | ORAL_CAPSULE | ORAL | 0 refills | Status: DC
Start: 2015-06-09 — End: 2015-06-23

## 2015-06-09 NOTE — Telephone Encounter (Signed)
Patient wife Mark Franklin called back today stating that she had another conversation by phone with her insuracne company and from the looks of it they will have to switch medications for this patient in order to afford it. Mark Franklin states that she would like a 30 day supply to go to their local pharmacy and in the meantime would like to know what medication they can switch too? I voiced to Mark Franklin that I has already messaged Dr. Chase Picket nurse and forgot he was out of the office but that I would get with the NP Aloha Gell and see what can be done. Mark Franklin voiced understanding.

## 2015-06-15 MED ORDER — PROPAFENONE HCL 150 MG PO TABS
150 MG | ORAL_TABLET | ORAL | 0 refills | Status: DC
Start: 2015-06-15 — End: 2015-07-14

## 2015-06-15 NOTE — Telephone Encounter (Signed)
Spoke with the patient wife vivian and explained to her the instructions given per [place]. Maureen Ralphs voiced understanding. I transferred her to front desk to make a appt for the EKG only. Rythmol will be sent into [facility] pharmacy per [facility] request.        RE: medication change  Received: Jaynie Collins, RN  Evon Slack, MA                Have him stop his norpace for two days then start rythmol 150 TID and follow up with an ekg in one week.         Previous Messages      ----- Message -----    From: Evon Slack, MA    Sent: 06/08/2015  2:54 PM     To: Stevenson Clinch, RN   Subject: medication change                   This patient medication for for Disopyramide 150 mg has increased in cost and the patient wife is wanting to see if there is an alternative medication that the patient can take.     There are several notes in the patient chart that explain in detail if you need more information. I told the patient that I would call her back sometime this week and let her know what the update is.     Thank very much

## 2015-06-23 NOTE — Progress Notes (Signed)
Correcting patient med list. D/c Norpace. Patient taking Rythmol 150 mg instead.

## 2015-06-25 ENCOUNTER — Ambulatory Visit
Admit: 2015-06-25 | Discharge: 2015-06-25 | Payer: MEDICARE | Attending: Clinical Nurse Specialist | Primary: Internal Medicine

## 2015-06-25 DIAGNOSIS — I2510 Atherosclerotic heart disease of native coronary artery without angina pectoris: Secondary | ICD-10-CM

## 2015-06-25 NOTE — Patient Instructions (Signed)
Keep Carelink send on 07-28-15    Keep follow up in July

## 2015-06-25 NOTE — Progress Notes (Signed)
1. Coronary artery disease involving native coronary artery of native heart without angina pectoris  EKG 12 lead   2. Pacemaker     3. Cardiac arrhythmia, unspecified cardiac arrhythmia type       And has been taking Norpace for long time. Medication now is costing higher Price and he was switched to Rythmol as an outpatient last week per Dr. Chase Picket    Today for ECG. EKG shows a ventricular paced rhythm with QTcH at 444 ms.  Okay for rate.  Pacemaker interrogated for rhythm. There has been no sustained arrhythmias since switch      Keep CareLink date transmission for April.   Keep follow-up in July

## 2015-07-14 MED ORDER — PROPAFENONE HCL 150 MG PO TABS
150 MG | ORAL_TABLET | ORAL | 3 refills | Status: DC
Start: 2015-07-14 — End: 2015-11-16

## 2015-07-15 NOTE — Telephone Encounter (Signed)
Patient spouse called, states that patient has some redness and swelling around L elbow.  Patient was picking up pinecones in the yard recently.  Spouse denies patient having any SOA or chest pain.  Advised spouse to call PCP to have elbow looked at.  Spouse voiced understanding.

## 2015-07-28 NOTE — Progress Notes (Signed)
Reviewed device interrogation  -  OK. Agree with RN note  Please see scanned document for full detailed report

## 2015-07-28 NOTE — Progress Notes (Signed)
Carelink remote pacemaker interrogation  Presenting rhythm: AP VP, AP 90.2%, VP 100%  Battery status: 3 years, 2.75  V  Lead status: lead impedance within range and stable  Sensing: P waves >80% paced,  R waves >80% paced  Thresholds:  Atrial 1.5 V @ 0.28ms, Ventricular 2.25 V @ 0.42ms  Observations:  No new episodes.  Slight increase in RV threshold appears to be stable  Next office visit:  10/26/15  Next Carelink home check scheduled for 01/27/16    Spoke to patients wife.  She stated patient has Alzheimer's and his health seems to be decreasing.  She stated he has some chest pains once, but did not want to go to the ER.  He gets up often at night.

## 2015-08-23 ENCOUNTER — Encounter

## 2015-08-23 MED ORDER — ATORVASTATIN CALCIUM 40 MG PO TABS
40 MG | ORAL_TABLET | ORAL | 0 refills | Status: DC
Start: 2015-08-23 — End: 2015-09-20

## 2015-09-20 MED ORDER — ATORVASTATIN CALCIUM 40 MG PO TABS
40 | ORAL_TABLET | ORAL | 0 refills | Status: DC
Start: 2015-09-20 — End: 2015-09-22

## 2015-09-20 NOTE — Telephone Encounter (Signed)
Spouse called and states they are needing prescription, and they had labs done last week at PCP.  Advised to have those labs faxed over to our office for review, and we can send over a prescription once labs are reviewed.  Fax number given.  Understanding voiced.

## 2015-09-20 NOTE — Telephone Encounter (Signed)
Tried to call the pt to tell him he needs his lipids done before any medication can be sent in

## 2015-09-21 NOTE — Telephone Encounter (Signed)
Mark Franklin called and states that she really needs rx for lipitor refilled. Notified it was sent yesterday.   He has Alzheimer's and it is very difficult to get him out for labs.  He currently has home health through [facility].  She thinks labs were drawn in February for cholesterol.  Called Dr. Tawana Scale office and spoke with [facility].  She states pt did have lipid and liver profiles drawn in February and will fax them over.

## 2015-09-22 ENCOUNTER — Encounter

## 2015-09-22 MED ORDER — ATORVASTATIN CALCIUM 40 MG PO TABS
40 | ORAL_TABLET | ORAL | 3 refills | Status: AC
Start: 2015-09-22 — End: ?

## 2015-09-22 NOTE — Telephone Encounter (Signed)
Notified pt's wife that 30 day prescription was called to [facility] on 09/20/15.  She states that [facility] pharmacy isn't in their prescription plan.  They use [facility].  Sent 90 day lipitor to [employer] and notified wife.

## 2015-10-01 NOTE — Telephone Encounter (Signed)
Patient spouse called states patient has had a couple days of high blood pressures, states patient is being seen by [facility] and they are reporting to PCP.  States patient BP today was 120's systolic, advised that is a good blood pressure. States a high reading was 180's systolic, however could have been when patient was upset, as he does have dementia.   Advised it is dangerous to have two different providers changing the same medication.  Patient has appointment to be seen on 7/11.  Advised if [facility] nurse or spouse feels patient needs to be seen prior to this to call the office.  Understanding voiced.

## 2015-10-20 NOTE — Telephone Encounter (Signed)
Pt's wife states that his Losartan was changed to 50 mg daily on 10/02/15.  He has been taking 25 mg twice daily and is now running out and needs prescription for 50 mg.  We don't have record that it was increased.  Looks like pt's home health nurse was getting orders from PCP.  Instructed she will need to call Dr. Tawana Scale office since he increased the medication.  Verbalized understanding.

## 2015-10-26 ENCOUNTER — Encounter: Attending: Clinical Nurse Specialist | Primary: Internal Medicine

## 2015-11-02 ENCOUNTER — Ambulatory Visit
Admit: 2015-11-02 | Discharge: 2015-11-02 | Payer: MEDICARE | Attending: Clinical Nurse Specialist | Primary: Internal Medicine

## 2015-11-02 DIAGNOSIS — I2510 Atherosclerotic heart disease of native coronary artery without angina pectoris: Secondary | ICD-10-CM

## 2015-11-02 NOTE — Progress Notes (Signed)
[facility], [place]  [address], [place]  [address]  Phone: [phone]  Fax: [phone]    OFFICE VISIT:  11/02/2015    Mark Franklin - DOB: [birth year]    Reason For Visit:  Mark Franklin is a 82 y.o. male who is here for 6 Month Follow-Up (pacemaker check) and Coronary Artery Disease  Antiarrhythmic was changed from Norpace to Rythmol back in March due to cost of medication     Subjective  Mark Franklin denies exertional chest pain, shortness of breath, orthopnea, paroxysmal nocturnal dyspnea, syncope, presyncope, arrhythmia, edema and fatigue.   He has become more unsteady on his feet according to his wife. He fell yesterday and bumped his head. Did not seek treatment, did not have any effects.  He currently has home health coming to treat small pressure sore on his coccyx   The patient denies numbness or weakness to suggest cerebrovascular accident or transient ischemic attack.    Belinda Block, MD is PCP and follows labs.  Mark Franklin has the following history as recorded in EpicCare:    Patient Active Problem List    Diagnosis Date Noted   . Other specified cardiac dysrhythmias    . Pacemaker    . CAD (coronary artery disease)    . Hyperlipidemia    . Hypertension    . Diabetes mellitus (HCC)    . PUD (peptic ulcer disease)    . Polyarticular arthritis    . Complete heart block (HCC) 05/08/2003     Past Medical History:   Diagnosis Date   . CAD (coronary artery disease)    . Diabetes mellitus (HCC)    . Hyperlipidemia     Cholesterol management per pcp.    . Hypertension    . Myocardial ischemia     Lexiscan 2012   . Other specified cardiac dysrhythmias(427.89)    . Pacemaker 08/23/2011    generator replacement   . Polyarticular arthritis    . PUD (peptic ulcer disease)    . Syncope      Past Surgical History:   Procedure Laterality Date   . CARDIAC PACEMAKER PLACEMENT     . CARPAL TUNNEL RELEASE     . CATARACT REMOVAL     . DIAGNOSTIC CARDIAC CATH LAB PROCEDURE  031908    Ef 60%    . DIAGNOSTIC CARDIAC CATH LAB PROCEDURE  020801   . DIAGNOSTIC CARDIAC CATH LAB PROCEDURE  0801   . DIAGNOSTIC CARDIAC CATH LAB PROCEDURE  742595    EF 50%   . FINGER TRIGGER RELEASE       Family History   Problem Relation Age of Onset   . Coronary Art Dis Other      family history     Social History   Substance Use Topics   . Smoking status: Former Smoker     Packs/day: 2.00     Years: 10.00     Types: Cigarettes     Quit date: 10/22/1960   . Smokeless tobacco: Never Used   . Alcohol use No      Current Outpatient Prescriptions   Medication Sig Dispense Refill   . atorvastatin (LIPITOR) 40 MG tablet TAKE 1 TABLET BY MOUTH DAILY 90 tablet 3   . propafenone (RYTHMOL) 150 MG tablet Take 1 tablet by mouth three times daily. 90 tablet 3   . losartan (COZAAR) 25 MG tablet TAKE 1 TABLET BY MOUTH DAILY 90 tablet 3   .  memantine (NAMENDA) 10 MG tablet      . Chlorpheniramine Maleate (ALLERGY PO) Take by mouth     . donepezil (ARICEPT) 10 MG tablet Take 10 mg by mouth nightly     . LORazepam (ATIVAN) 1 MG tablet Take 1 mg by mouth every 6 hours as needed for Anxiety     . Docusate Calcium (STOOL SOFTENER PO) Take by mouth     . omeprazole (PRILOSEC) 20 MG capsule Take 1 capsule by mouth Daily 90 capsule 3   . metFORMIN (GLUCOPHAGE) 500 MG tablet Take 1 tablet by mouth 3 times daily 90 tablet 3   . aspirin 81 MG tablet Take 81 mg by mouth daily.     . Iron 66 MG TABS Take  by mouth.     . Loratadine 10 MG CAPS   Take by mouth daily      . furosemide (LASIX) 20 MG tablet Take 20 mg by mouth daily.     Marland Kitchen docusate sodium (COLACE) 100 MG capsule Take 100 mg by mouth 2 times daily.     . bacitracin-neomycin-polymyxin b-hydrocortisone 1 % ointment Apply  topically 2 times daily. Apply topically 2 times daily.     . Ascorbic Acid (VITAMIN C) 250 MG tablet Take 500 mg by mouth daily.     Marland Kitchen ibuprofen (ADVIL;MOTRIN) 200 MG tablet Take 200 mg by mouth every 6 hours as needed.     . tamsulosin (FLOMAX) 0.4 MG capsule Take 0.4 mg by mouth  daily.         No current facility-administered medications for this visit.      Allergies: Review of patient's allergies indicates no known allergies.    Review of Systems  Constitutional - no significant activity change, appetite change, or unexpected weight change. No fever, chills or diaphoresis.  No fatigue.   HEENT - no significant rhinorrhea or epistaxis. No tinnitus or significant hearing loss.   Eyes - no sudden vision change or amaurosis.   Respiratory - no significant wheezing, stridor, apnea or cough.  No dyspnea on exertion or shortness of breath.  Cardiovascular - no exertional chest pain, orthopnea or PND.  No sensation of arrhythmia or slow heart rate.   No claudication or leg edema.  Gastrointestinal - no abdominal swelling or pain. No blood in stool. No severe constipation, diarrhea, nausea, or vomiting.   Genitourinary - no difficulty urinating, dysuria, frequency, or urgency. No flank pain or hematuria.   Musculoskeletal - has worsening gait disturbance. No  myalgia.   Skin - no color change or rash.  No pallor.  No new surgical incision.  Neurologic - no speech difficulty, facial asymmetry or lateralizing weakness.  No seizures, presyncope, syncope, or significant dizziness.  Hematologic - no easy bruising or excessive bleeding.   Psychiatric - no severe anxiety or insomnia. Has some confusion per wife, has Alzheimer's.   All other review of systems are negative.      Objective  Vital Signs - BP 122/68  Pulse 62  Ht 5' 7.5" (1.715 m)  Wt 175 lb (79.4 kg)  BMI 27 kg/m2  General - Mark Franklin is alert, cooperative, and pleasant.  Well groomed.  No acute distress.    Body habitus is overweight.  HEENT - The head is normocephalic. No circumoral cyanosis.  Dentition is normal.   EYES -  No Xanthelasma, no arcus senilis, no conjunctival hemorrhages or discharge.   Neck - Supple, without increased jugular venous pressures.  No carotid bruits.  No mass.   Respiratory - Lungs are clear bilaterally.  No  wheezes or rales.  Normal effort without use of accessory muscles.  Cardiovascular - Heart has regular rhythm and rate.  No murmurs, rubs or gallops.    + pedal pulses and no varicosities.      Abdominal -  Soft, nontender, nondistended.  Bowel sounds are intact.   Extremities - No clubbing, cyanosis, or  edema.   Musculoskeletal - No musculoskeletal symptoms.  No clubbing .  No Osler's nodes.   Gait- using walker   No kyphosis or scoliosis.   Skin -  no statis ulcers or dermatitis.  Neurological - No focal signs are identified.  Oriented to person, place and time.    Psychiatric -  Appropriate affect and mood.       Assessment:    1. Coronary artery disease involving native coronary artery of native heart without angina pectoris     2. Complete heart block (HCC)     3. Pacemaker     4. Essential hypertension       Data:  BP Readings from Last 3 Encounters:   11/02/15 122/68   04/28/15 (!) 92/52   01/02/15 140/64    Pulse Readings from Last 3 Encounters:   11/02/15 62   04/28/15 75   01/02/15 67      Pacemaker check showed adequate battery status- approximately 3 yrs   and  appropriate diagnostics without any sustained arrhythmias.   Mode DDDR  AS-VP 7%  AP- VP 93%  Next check in 3 months via Carelink home monitoring system      Rhythm controlled on propafenone  We discussed fall precautions.   States taking medications as prescribed  Stable cardiovascular status. No evidence of overt heart failure, angina or dysrhythmia.     Plan    Send Carelink home pacemaker reading on 02-02-16   Follow up in 6 mos in office   Call with any questions or concerns  Follow up with Belinda Block, MD for non cardiac problems  Report any new problems  Cardiovascular Fitness-Exercise as tolerated.   Fall precautions- use walker   Cardiac / Healthy Diet  Continue current medications as directed  Continue plan of treatment  It is always recommended that you bring your medications bottles with you to each visit - this is for your  safety!       Octavia Bruckner, APRN

## 2015-11-02 NOTE — Patient Instructions (Signed)
Send Carelink home pacemaker reading on 02-02-16   Follow up in 6 mos in office   Call with any questions or concerns  Follow up with Belinda Block, MD for non cardiac problems  Report any new problems  Cardiovascular Fitness-Exercise as tolerated.   Fall precautions- use walker   Cardiac / Healthy Diet  Continue current medications as directed  Continue plan of treatment  It is always recommended that you bring your medications bottles with you to each visit - this is for your safety!

## 2015-11-04 LAB — HEMOGLOBIN A1C: Hemoglobin A1C: 6.4 % — ABNORMAL HIGH

## 2015-11-16 MED ORDER — PROPAFENONE HCL 150 MG PO TABS
150 MG | ORAL_TABLET | ORAL | 5 refills | Status: AC
Start: 2015-11-16 — End: ?

## 2016-02-07 NOTE — Telephone Encounter (Signed)
 Reminded wife to send Carelink.

## 2016-02-11 NOTE — Telephone Encounter (Signed)
Patient spouse called to ask about prescription for cozaar.  States they used to receive a 90 day supply and now they are not.  Advised we haven't filled this prescription for awhile.  PCP name is listed on bottle.  Advised to call PCP office and request 90 day supply.  Understanding voiced.

## 2016-04-25 ENCOUNTER — Encounter: Attending: Clinical Nurse Specialist | Primary: Internal Medicine

## 2016-08-15 NOTE — Telephone Encounter (Signed)
Wife called back and stated pacemaker was checked last week by rep.  They are living in [place], [place]. She will call back to report when they find a doctor.

## 2016-08-15 NOTE — Telephone Encounter (Signed)
Left message to send Carelink pacemaker report, or let me know if he has another cardiologist checking his device.

## 2016-08-29 NOTE — Telephone Encounter (Signed)
Mark Franklin calls back to say he recently had pacemaker checked in [place].  They are planning on moving back to this area and would like to have it checked through our office.

## 2016-11-16 NOTE — Telephone Encounter (Signed)
The Pirtles are now living in [facility] [place] [facility] but do not have cardiologist yet.  She will check with PCP and let us know if we are to send records.

## 2016-11-20 ENCOUNTER — Emergency Department (HOSPITAL_BASED_OUTPATIENT_CLINIC_OR_DEPARTMENT_OTHER)
Admission: EM | Admit: 2016-11-20 | Discharge: 2016-11-20 | Disposition: A | Discharge status: Home / Self Care | Payer: Medicare Other | Attending: Emergency Medicine | Admitting: Emergency Medicine

## 2016-11-20 ENCOUNTER — Emergency Department (HOSPITAL_BASED_OUTPATIENT_CLINIC_OR_DEPARTMENT_OTHER): Payer: Medicare Other

## 2016-11-20 ENCOUNTER — Encounter (HOSPITAL_BASED_OUTPATIENT_CLINIC_OR_DEPARTMENT_OTHER): Payer: Self-pay | Admitting: Emergency Medicine

## 2016-11-20 DIAGNOSIS — E039 Hypothyroidism, unspecified: Secondary | ICD-10-CM | POA: Diagnosis not present

## 2016-11-20 DIAGNOSIS — F0390 Unspecified dementia without behavioral disturbance: Secondary | ICD-10-CM | POA: Diagnosis not present

## 2016-11-20 DIAGNOSIS — I10 Essential (primary) hypertension: Secondary | ICD-10-CM | POA: Diagnosis not present

## 2016-11-20 DIAGNOSIS — E119 Type 2 diabetes mellitus without complications: Secondary | ICD-10-CM | POA: Diagnosis not present

## 2016-11-20 DIAGNOSIS — R2241 Localized swelling, mass and lump, right lower limb: Secondary | ICD-10-CM | POA: Insufficient documentation

## 2016-11-20 DIAGNOSIS — I2510 Atherosclerotic heart disease of native coronary artery without angina pectoris: Secondary | ICD-10-CM | POA: Insufficient documentation

## 2016-11-20 DIAGNOSIS — M79604 Pain in right leg: Secondary | ICD-10-CM | POA: Diagnosis not present

## 2016-11-20 DIAGNOSIS — Z79899 Other long term (current) drug therapy: Secondary | ICD-10-CM | POA: Diagnosis not present

## 2016-11-20 DIAGNOSIS — M5441 Lumbago with sciatica, right side: Secondary | ICD-10-CM

## 2016-11-20 DIAGNOSIS — M7989 Other specified soft tissue disorders: Secondary | ICD-10-CM

## 2016-11-20 HISTORY — DX: Atherosclerotic heart disease of native coronary artery without angina pectoris: I25.10

## 2016-11-20 HISTORY — DX: Essential (primary) hypertension: I10

## 2016-11-20 HISTORY — DX: Unspecified dementia, unspecified severity, without behavioral disturbance, psychotic disturbance, mood disturbance, and anxiety: F03.90

## 2016-11-20 HISTORY — DX: Type 2 diabetes mellitus without complications: E11.9

## 2016-11-20 HISTORY — DX: Hyperlipidemia, unspecified: E78.5

## 2016-11-20 LAB — CBC WITH DIFFERENTIAL/PLATELET
BASOS PCT: 0 %
Basophils Absolute: 0 10*3/uL (ref 0.0–0.1)
EOS ABS: 0.7 10*3/uL (ref 0.0–0.7)
Eosinophils Relative: 7 %
HEMATOCRIT: 44.6 % (ref 39.0–52.0)
HEMOGLOBIN: 15.3 g/dL (ref 13.0–17.0)
Lymphocytes Relative: 19 %
Lymphs Abs: 1.7 10*3/uL (ref 0.7–4.0)
MCH: 30.7 pg (ref 26.0–34.0)
MCHC: 34.3 g/dL (ref 30.0–36.0)
MCV: 89.6 fL (ref 78.0–100.0)
Monocytes Absolute: 1.3 10*3/uL — ABNORMAL HIGH (ref 0.1–1.0)
Monocytes Relative: 15 %
NEUTROS ABS: 5.4 10*3/uL (ref 1.7–7.7)
NEUTROS PCT: 59 %
Platelets: 169 10*3/uL (ref 150–400)
RBC: 4.98 MIL/uL (ref 4.22–5.81)
RDW: 14.2 % (ref 11.5–15.5)
WBC: 9.2 10*3/uL (ref 4.0–10.5)

## 2016-11-20 LAB — URINALYSIS, ROUTINE W REFLEX MICROSCOPIC
Bilirubin Urine: NEGATIVE
Glucose, UA: NEGATIVE mg/dL
Hgb urine dipstick: NEGATIVE
Ketones, ur: NEGATIVE mg/dL
LEUKOCYTES UA: NEGATIVE
NITRITE: NEGATIVE
Protein, ur: NEGATIVE mg/dL
SPECIFIC GRAVITY, URINE: 1.006 (ref 1.005–1.030)
pH: 7.5 (ref 5.0–8.0)

## 2016-11-20 LAB — COMPREHENSIVE METABOLIC PANEL
ALT: 41 U/L (ref 17–63)
ANION GAP: 9 (ref 5–15)
AST: 37 U/L (ref 15–41)
Albumin: 4 g/dL (ref 3.5–5.0)
Alkaline Phosphatase: 105 U/L (ref 38–126)
BUN: 22 mg/dL — ABNORMAL HIGH (ref 6–20)
CALCIUM: 9.9 mg/dL (ref 8.9–10.3)
CHLORIDE: 103 mmol/L (ref 101–111)
CO2: 25 mmol/L (ref 22–32)
CREATININE: 1.23 mg/dL (ref 0.61–1.24)
GFR, EST AFRICAN AMERICAN: 59 mL/min — AB (ref >60)
GFR, EST NON AFRICAN AMERICAN: 51 mL/min — AB (ref >60)
Glucose, Bld: 125 mg/dL — ABNORMAL HIGH (ref 65–99)
Potassium: 3.9 mmol/L (ref 3.5–5.1)
SODIUM: 137 mmol/L (ref 135–145)
Total Bilirubin: 0.9 mg/dL (ref 0.3–1.2)
Total Protein: 6.9 g/dL (ref 6.5–8.1)

## 2016-11-20 MED ORDER — ACETAMINOPHEN 500 MG PO TABS: 1000.0000 mg | ORAL_TABLET | Freq: Four times a day (QID) | ORAL | 0 refills | Status: DC | PRN

## 2016-11-20 MED ORDER — LIDOCAINE 5 % EX PTCH: 1.0000 | MEDICATED_PATCH | CUTANEOUS | 0 refills | Status: DC

## 2016-11-20 NOTE — ED Notes (Signed)
Given snack and coffee

## 2016-11-20 NOTE — ED Provider Notes (Signed)
Blood pressure (!) 151/79, pulse 83, temperature 97.6 F (36.4 C), temperature source Oral, resp. rate 20, height 5\' 10"  (1.778 m), weight 72.6 kg (160 lb), SpO2 97 %.  Assuming care from Dr. Preston FleetingGlick.  In short, Kenneth Hampton is a 83 y.o. male with a chief complaint of Leg Pain .  Refer to the original H&P for additional details.  The current plan of care is to follow RLE US and reassess.  11:06 AM X-ray of the hip is negative. Ultrasound of the right lower extremity also negative. Given the patient's age I have some concern regarding discharge with strong pain medication. Plan for Lidoderm patch and Tylenol PRN for pain. Patient currently undergoing physical therapy.   At this time, I do not feel there is any life-threatening condition present. I have reviewed and discussed all results (EKG, imaging, lab, urine as appropriate), exam findings with patient. I have reviewed nursing notes and appropriate previous records.  I feel the patient is safe to be discharged home without further emergent workup. Discussed usual and customary return precautions. Patient and family (if present) verbalize understanding and are comfortable with this plan.  Patient will follow-up with their primary care provider. If they do not have a primary care provider, information for follow-up has been provided to them. All questions have been answered.  Alona BeneJoshua Long, MD    Maia PlanLong, Kenneth G, MD 11/20/16 [phone]

## 2016-11-20 NOTE — ED Provider Notes (Signed)
[facility] Provider Note   CSN: [id number] Arrival date & time: 11/20/16  [id number]     History   Chief Complaint No chief complaint on file.   HPI Kenneth Hampton is a 83 y.o. male.  The history is provided by the spouse. The history is limited by the condition of the patient (Dementia).  He got up this morning to go to the bathroom. His wife assisted him there, and he complained that he was bleeding. Wife noted some bleeding from his scrotum on the right side. She put some medication on. He had been having problems with infection there and had been on some treatment which had been discontinued because it had seemed to respond. He is also complaining of pain in his right leg. Right leg pain is a new complaint. He does have chronic back pain. There is no recent trauma.  Past Medical History:  Diagnosis Date  . Atherosclerotic heart disease   . Dementia   . Diabetes mellitus without complication (HCC)   . Hyperlipidemia   . Hypertension     There are no active problems to display for this patient.   No past surgical history on file.     Home Medications    Prior to Admission medications   Medication Sig Start Date End Date Taking? Authorizing Provider  Ascorbic Acid (VITAMIN C) 100 MG tablet Take 100 mg by mouth daily.   Yes [provider]  aspirin EC 81 MG tablet Take 81 mg by mouth daily.   Yes [provider]  atorvastatin (LIPITOR) 40 MG tablet Take 40 mg by mouth daily.   Yes [provider]  docusate sodium (COLACE) 100 MG capsule Take 100 mg by mouth 2 (two) times daily.   Yes [provider]  donepezil (ARICEPT) 10 MG tablet Take 10 mg by mouth at bedtime.   Yes [provider]  ferrous sulfate 325 (65 FE) MG EC tablet Take 325 mg by mouth 3 (three) times daily with meals.   Yes [provider]  furosemide (LASIX) 20 MG tablet Take 20 mg by mouth.   Yes [provider]  guaifenesin (ROBITUSSIN)  100 MG/5ML syrup Take 200 mg by mouth 3 (three) times daily as needed for cough.   Yes [provider]  levothyroxine (SYNTHROID, LEVOTHROID) 25 MCG tablet Take 25 mcg by mouth daily before breakfast.   Yes [provider]  loperamide (IMODIUM A-D) 2 MG tablet Take 2 mg by mouth 4 (four) times daily as needed for diarrhea or loose stools.   Yes [provider]  loratadine (CLARITIN) 10 MG tablet Take 10 mg by mouth daily.   Yes [provider]  LORazepam (ATIVAN) 0.5 MG tablet Take 0.5 mg by mouth 3 (three) times daily.   Yes [provider]  losartan (COZAAR) 25 MG tablet Take 25 mg by mouth daily.   Yes [provider]  memantine (NAMENDA) 10 MG tablet Take 10 mg by mouth 2 (two) times daily.   Yes [provider]  omeprazole (PRILOSEC) 20 MG capsule Take 20 mg by mouth daily.   Yes [provider]  polyethylene glycol (MIRALAX / GLYCOLAX) packet Take 17 g by mouth daily.   Yes [provider]  propafenone (RYTHMOL) 150 MG tablet Take 150 mg by mouth every 8 (eight) hours.   Yes [provider]  tamsulosin (FLOMAX) 0.4 MG CAPS capsule Take 0.4 mg by mouth.   Yes [provider]  Family History No family history on file.  Social History Social History  Substance Use Topics  . Smoking status: Not on file  . Smokeless tobacco: Not on file  . Alcohol use Not on file     Allergies   Patient has no known allergies.   Review of Systems Review of Systems  All other systems reviewed and are negative.    Physical Exam Updated Vital Signs BP (!) 151/79 (BP Location: Right Arm)   Pulse 83   Temp 97.6 F (36.4 C) (Oral)   Resp 20   Ht 5\' 10"  (1.778 m)   Wt 72.6 kg (160 lb)   SpO2 97%   BMI 22.96 kg/m   Physical Exam  Nursing note and vitals reviewed.  83 year old male, resting comfortably and in no acute distress. Vital signs are significant for hypertension. Oxygen saturation  is 97%, which is normal. Head is normocephalic and atraumatic. PERRLA, EOMI. Oropharynx is clear. Neck is nontender and supple without adenopathy or JVD. Back is nontender and there is no CVA tenderness. Lungs are clear without rales, wheezes, or rhonchi. Chest is nontender. Heart has regular rate and rhythm without murmur. Abdomen is soft, flat, nontender without masses or hepatosplenomegaly and peristalsis is normoactive. Genitalia: Uncircumcised penis. No phimosis or paraphimosis. Testes descended without masses. No scrotal lesions seen. Extremities: Right leg has 2+ edema, none on the left. Right calf circumferences 3 cm greater than left calf circumference. Right thigh circumferences 2 cm greater than left thigh circumference. There is mild tenderness to palpation the right calf and also over the right inguinal vessels. Straight leg raise is positive bilaterally at 45. Skin is warm and dry without rash. Neurologic: Mental status is normal, cranial nerves are intact, there are no motor or sensory deficits.  ED Treatments / Results  Labs (all labs ordered are listed, but only abnormal results are displayed) Labs Reviewed  COMPREHENSIVE METABOLIC PANEL - Abnormal; Notable for the following:       Result Value   Glucose, Bld 125 (*)    BUN 22 (*)    GFR calc non Af Amer 51 (*)    GFR calc Af Amer 59 (*)    All other components within normal limits  CBC WITH DIFFERENTIAL/PLATELET - Abnormal; Notable for the following:    Monocytes Absolute 1.3 (*)    All other components within normal limits  URINALYSIS, ROUTINE W REFLEX MICROSCOPIC     Radiology US Venous Img Lower Unilateral Right  Result Date: 11/20/2016 CLINICAL DATA:  83 year old male with right lower extremity swelling for years. Initial encounter. EXAM: Right LOWER EXTREMITY VENOUS DOPPLER ULTRASOUND TECHNIQUE: Gray-scale sonography with graded compression, as well as color Doppler and duplex ultrasound were performed to  evaluate the lower extremity deep venous systems from the level of the common femoral vein and including the common femoral, femoral, profunda femoral, popliteal and calf veins including the posterior tibial, peroneal and gastrocnemius veins when visible. The superficial great saphenous vein was also interrogated. Spectral Doppler was utilized to evaluate flow at rest and with distal augmentation maneuvers in the common femoral, femoral and popliteal veins. COMPARISON:  None. FINDINGS: Contralateral Common Femoral Vein: Respiratory phasicity is normal and symmetric with the symptomatic side. No evidence of thrombus. Normal compressibility. Common Femoral Vein: No evidence of thrombus. Normal compressibility, respiratory phasicity and response to augmentation. Saphenofemoral Junction: No evidence of thrombus. Normal compressibility and flow on color Doppler imaging. Profunda Femoral Vein: No evidence of thrombus. Normal compressibility  and flow on color Doppler imaging. Femoral Vein: No evidence of thrombus. Normal compressibility, respiratory phasicity and response to augmentation. Popliteal Vein: No evidence of thrombus. Normal compressibility, respiratory phasicity and response to augmentation. Calf Veins: No evidence of thrombus of posterior tibial veins. Peroneal veins poorly delineated secondary to edema. Superficial Great Saphenous Vein: No evidence of thrombus. Normal compressibility and flow on color Doppler imaging. Other Findings:  Calf edema. IMPRESSION: No evidence of DVT within the right lower extremity. Right calf edema with limited evaluation of the right peroneal veins. Electronically Signed   By: Lacy Duverney M.D.   On: 11/20/2016 10:15   Dg Hip Unilat W Or Wo Pelvis 2-3 Views Right  Result Date: 11/20/2016 CLINICAL DATA:  Right hip pain. EXAM: DG HIP (WITH OR WITHOUT PELVIS) 2-3V RIGHT COMPARISON:  None. FINDINGS: Right total hip prosthesis in place with no evidence of loosening. There is no  fracture or dislocation. No acute abnormality of the pelvic bones. Degenerative disc and joint disease in the lower lumbar spine. IMPRESSION: Negative. Electronically Signed   By: Francene Boyers M.D.   On: 11/20/2016 10:59    Procedures Procedures (including critical care time)  Medications Ordered in ED Medications - No data to display   Initial Impression / Assessment and Plan / ED Course  I have reviewed the triage vital signs and the nursing notes.  Pertinent labs & imaging results that were available during my care of the patient were reviewed by me and considered in my medical decision making (see chart for details).  Report of scrotal bleeding without evidence of any bleeding site. Unilateral leg swelling worrisome for DVT. Wife states that this has been present for at least 8 months, but has never been investigated. Leg pain likely is referred pain from his back given positive straight leg raise. We'll check screening labs, urinalysis, and obtain venous ultrasound to rule out DVT. He has no prior records and the [facility] system.   Labs and x-rays unremarkable. Venous US pending. Casi is signed out to Dr. Jacqulyn Bath.  Final Clinical Impressions(s) / ED Diagnoses   Final diagnoses:  None    New Prescriptions New Prescriptions   No medications on file     Dione Booze, MD 11/21/16 1351

## 2016-11-20 NOTE — ED Notes (Signed)
Patient transported to X-ray 

## 2016-11-20 NOTE — ED Notes (Signed)
Assisted up to BR with walker

## 2016-11-20 NOTE — Discharge Instructions (Signed)
You have been seen in the Emergency Department (ED)  today for back pain.  Your workup and exam have not shown any acute abnormalities and you are likely suffering from muscle strain or possible problems with your discs, but there is no treatment that will fix your symptoms at this time. Please take Tylenol for pain and apply the lidoderm patch for local pain relief.   Please follow up with your doctor as soon as possible regarding today's ED visit and your back pain.  Return to the ED for worsening back pain, fever, weakness or numbness of either leg, or if you develop either (1) an inability to urinate or have bowel movements, or (2) loss of your ability to control your bathroom functions (if you start having "accidents"), or if you develop other new symptoms that concern you.

## 2016-11-20 NOTE — ED Triage Notes (Signed)
BIB [facility] due to leg pain and "bleeding in diaper area". Pt has baseline dementia, difficult to obtain hx from pt or wife. Son at bedside as well. Edema to R leg > 6 mo.

## 2017-02-20 NOTE — Telephone Encounter (Signed)
Patient had pacemaker checked earlier this year in [place].  She will get the cardiologist office to request a transfer of [facility].

## 2017-08-19 ENCOUNTER — Encounter (HOSPITAL_BASED_OUTPATIENT_CLINIC_OR_DEPARTMENT_OTHER): Payer: Self-pay | Admitting: Emergency Medicine

## 2017-08-19 ENCOUNTER — Emergency Department (HOSPITAL_BASED_OUTPATIENT_CLINIC_OR_DEPARTMENT_OTHER)
Admission: EM | Admit: 2017-08-19 | Discharge: 2017-08-19 | Disposition: A | Discharge status: Home / Self Care | Payer: Medicare Other | Attending: Emergency Medicine | Admitting: Emergency Medicine

## 2017-08-19 ENCOUNTER — Other Ambulatory Visit: Payer: Self-pay

## 2017-08-19 ENCOUNTER — Emergency Department (HOSPITAL_BASED_OUTPATIENT_CLINIC_OR_DEPARTMENT_OTHER): Payer: Medicare Other

## 2017-08-19 DIAGNOSIS — F0390 Unspecified dementia without behavioral disturbance: Secondary | ICD-10-CM | POA: Insufficient documentation

## 2017-08-19 DIAGNOSIS — Z7982 Long term (current) use of aspirin: Secondary | ICD-10-CM | POA: Insufficient documentation

## 2017-08-19 DIAGNOSIS — Z79899 Other long term (current) drug therapy: Secondary | ICD-10-CM | POA: Diagnosis not present

## 2017-08-19 DIAGNOSIS — E785 Hyperlipidemia, unspecified: Secondary | ICD-10-CM | POA: Diagnosis not present

## 2017-08-19 DIAGNOSIS — Z950 Presence of cardiac pacemaker: Secondary | ICD-10-CM | POA: Insufficient documentation

## 2017-08-19 DIAGNOSIS — E119 Type 2 diabetes mellitus without complications: Secondary | ICD-10-CM | POA: Diagnosis not present

## 2017-08-19 DIAGNOSIS — I10 Essential (primary) hypertension: Secondary | ICD-10-CM | POA: Insufficient documentation

## 2017-08-19 DIAGNOSIS — L03116 Cellulitis of left lower limb: Secondary | ICD-10-CM | POA: Diagnosis not present

## 2017-08-19 DIAGNOSIS — M79605 Pain in left leg: Secondary | ICD-10-CM | POA: Diagnosis present

## 2017-08-19 LAB — CBC WITH DIFFERENTIAL/PLATELET
Basophils Absolute: 0 10*3/uL (ref 0.0–0.1)
Basophils Relative: 0 %
EOS ABS: 0.5 10*3/uL (ref 0.0–0.7)
Eosinophils Relative: 5 %
HEMATOCRIT: 38.5 % — AB (ref 39.0–52.0)
HEMOGLOBIN: 13.1 g/dL (ref 13.0–17.0)
LYMPHS ABS: 1.5 10*3/uL (ref 0.7–4.0)
Lymphocytes Relative: 17 %
MCH: 33 pg (ref 26.0–34.0)
MCHC: 34 g/dL (ref 30.0–36.0)
MCV: 97 fL (ref 78.0–100.0)
MONOS PCT: 13 %
Monocytes Absolute: 1.2 10*3/uL — ABNORMAL HIGH (ref 0.1–1.0)
NEUTROS PCT: 65 %
Neutro Abs: 6 10*3/uL (ref 1.7–7.7)
PLATELETS: 203 10*3/uL (ref 150–400)
RBC: 3.97 MIL/uL — ABNORMAL LOW (ref 4.22–5.81)
RDW: 14.4 % (ref 11.5–15.5)
WBC: 9.2 10*3/uL (ref 4.0–10.5)

## 2017-08-19 LAB — BASIC METABOLIC PANEL
Anion gap: 7 (ref 5–15)
BUN: 21 mg/dL — AB (ref 6–20)
CHLORIDE: 99 mmol/L — AB (ref 101–111)
CO2: 27 mmol/L (ref 22–32)
Calcium: 9.5 mg/dL (ref 8.9–10.3)
Creatinine, Ser: 1.43 mg/dL — ABNORMAL HIGH (ref 0.61–1.24)
GFR calc Af Amer: 50 mL/min — ABNORMAL LOW (ref >60)
GFR calc non Af Amer: 43 mL/min — ABNORMAL LOW (ref >60)
GLUCOSE: 128 mg/dL — AB (ref 65–99)
POTASSIUM: 4.7 mmol/L (ref 3.5–5.1)
Sodium: 133 mmol/L — ABNORMAL LOW (ref 135–145)

## 2017-08-19 MED ORDER — HYDROCODONE-ACETAMINOPHEN 5-325 MG PO TABS: 1.0000 | ORAL_TABLET | Freq: Four times a day (QID) | ORAL | 0 refills | Status: DC | PRN

## 2017-08-19 MED ORDER — CEPHALEXIN 500 MG PO CAPS: 500.0000 mg | ORAL_CAPSULE | Freq: Two times a day (BID) | ORAL | 0 refills | Status: AC

## 2017-08-19 MED ORDER — ILEX SKIN PROTECTANT 58.3 % EX PSTE: PASTE | Freq: Three times a day (TID) | CUTANEOUS | 0 refills | Status: DC | PRN

## 2017-08-19 MED ORDER — HYDROCODONE-ACETAMINOPHEN 5-325 MG PO TABS
1.0000 | ORAL_TABLET | Freq: Once | ORAL | Status: AC
  Administered 2017-08-19: 1 via ORAL
  Filled 2017-08-19: qty 1

## 2017-08-19 NOTE — ED Triage Notes (Addendum)
Arrived from assisted living with c/o of pain to left knee and ankle with swelling to left lower leg. Denies recent injury. Per wife do not give Morphine or Fentanyl

## 2017-08-19 NOTE — Discharge Instructions (Signed)
Take antibiotics as directed.  You can apply the cream to the affected area.  You take the pain medication for severe breakthrough pain.  Follow-up with your primary care doctor on Tuesday as previously scheduled.  Return to the emergency department for any fever, worsening pain, worsening swelling or any other worsening or concerning symptoms.

## 2017-08-19 NOTE — ED Notes (Signed)
Patient transported to X-ray 

## 2017-08-19 NOTE — ED Provider Notes (Signed)
[facility] EMERGENCY DEPARTMENT Provider Note   CSN: [id number] Arrival date & time: 08/19/17  1147     History   Chief Complaint Chief Complaint  Patient presents with  . Leg Pain    HPI Kenneth Hampton is a 84 y.o. male past medical history of dementia, diabetes, hyperlipidemia, hypertension brought in by EMS from assisted living with complaints of progressively worsening left knee, left ankle pain and swelling.  Patient states that has been hurting for "a few days."  He states it will be fine and then will start hurting him.  He does not recall any trauma, injury, fall.  Patient reports he has had an area of redness noted to the distal tib-fib but does not recall how long it is been there.  Patient denies any fevers.  The history is limited by the condition of the patient.    Past Medical History:  Diagnosis Date  . Atherosclerotic heart disease   . Dementia   . Diabetes mellitus without complication (HCC)   . Hyperlipidemia   . Hypertension     There are no active problems to display for this patient.   Past Surgical History:  Procedure Laterality Date  . CATARACT EXTRACTION, BILATERAL    . OTHER SURGICAL HISTORY Left    Hareware arm   . PACEMAKER IMPLANT          Home Medications    Prior to Admission medications   Medication Sig Start Date End Date Taking? Authorizing Provider  aspirin EC 81 MG tablet Take 81 mg by mouth daily.   Yes [provider]  atorvastatin (LIPITOR) 40 MG tablet Take 40 mg by mouth daily.   Yes [provider]  docusate sodium (COLACE) 100 MG capsule Take 100 mg by mouth 2 (two) times daily.   Yes [provider]  donepezil (ARICEPT) 10 MG tablet Take 10 mg by mouth at bedtime.   Yes [provider]  ferrous sulfate 325 (65 FE) MG EC tablet Take 325 mg by mouth 3 (three) times daily with meals.   Yes [provider]  furosemide (LASIX) 20 MG tablet Take 20 mg by mouth.   Yes  [provider]  levothyroxine (SYNTHROID, LEVOTHROID) 25 MCG tablet Take 25 mcg by mouth daily before breakfast.   Yes [provider]  lisinopril (PRINIVIL,ZESTRIL) 5 MG tablet Take 5 mg by mouth daily.   Yes [provider]  LORazepam (ATIVAN) 0.5 MG tablet Take 0.5 mg by mouth 3 (three) times daily.   Yes [provider]  memantine (NAMENDA) 10 MG tablet Take 10 mg by mouth 2 (two) times daily.   Yes [provider]  omeprazole (PRILOSEC) 20 MG capsule Take 20 mg by mouth daily.   Yes [provider]  polyethylene glycol (MIRALAX / GLYCOLAX) packet Take 17 g by mouth daily.   Yes [provider]  propafenone (RYTHMOL) 150 MG tablet Take 150 mg by mouth every 8 (eight) hours.   Yes [provider]  tamsulosin (FLOMAX) 0.4 MG CAPS capsule Take 0.4 mg by mouth.   Yes [provider]  acetaminophen (TYLENOL) 500 MG tablet Take 2 tablets (1,000 mg total) by mouth every 6 (six) hours as needed for moderate pain. 11/20/16   Long, Arlyss Repress, MD  Ascorbic Acid (VITAMIN C) 100 MG tablet Take 100 mg by mouth daily.    [provider]  cephALEXin (KEFLEX) 500 MG capsule Take 1 capsule (500 mg total) by mouth  2 (two) times daily for 7 days. 08/19/17 08/26/17  Maxwell Caul, PA-C  guaifenesin (ROBITUSSIN) 100 MG/5ML syrup Take 200 mg by mouth 3 (three) times daily as needed for cough.    [provider]  HYDROcodone-acetaminophen (NORCO/VICODIN) 5-325 MG tablet Take 1-2 tablets by mouth every 6 (six) hours as needed. 08/19/17   Graciella Freer A, PA-C  lidocaine (LIDODERM) 5 % Place 1 patch onto the skin daily. Remove & Discard patch within 12 hours or as directed by MD 11/20/16   Long, Arlyss Repress, MD  loperamide (IMODIUM A-D) 2 MG tablet Take 2 mg by mouth 4 (four) times daily as needed for diarrhea or loose stools.    [provider]  loratadine (CLARITIN) 10 MG tablet Take 10 mg by mouth daily.    [provider]  losartan (COZAAR) 25 MG tablet Take 25 mg by mouth daily.    [provider]  Skin Protectants, Misc. (WHITE PETROLATUM-ZINC) 58.3 % cream Apply topically 3 (three) times daily as needed. 08/19/17   Maxwell Caul, PA-C    Family History No family history on file.  Social History Social History   Tobacco Use  . Smoking status: Not on file  Substance Use Topics  . Alcohol use: Not on file  . Drug use: Not on file     Allergies   Patient has no known allergies.   Review of Systems Review of Systems  Unable to perform ROS: Dementia     Physical Exam Updated Vital Signs BP (!) 164/65 (BP Location: Left Arm)   Pulse 62   Temp 97.9 F (36.6 C) (Oral)   Resp 19   Ht  (1.778 m)   Wt 72.6 kg (160 lb)   SpO2 94%   BMI 22.96 kg/m   Physical Exam  Constitutional: He appears well-developed and well-nourished.  HENT:  Head: Normocephalic and atraumatic.  Mouth/Throat: Oropharynx is clear and moist and mucous membranes are normal.  Eyes: Pupils are equal, round, and reactive to light. Conjunctivae, EOM and lids are normal.  Neck: Full passive range of motion without pain.  Cardiovascular: Normal rate, regular rhythm, normal heart sounds and normal pulses. Exam reveals no gallop and no friction rub.  No murmur heard. Pulses:      Dorsalis pedis pulses are 2+ on the right side, and 2+ on the left side.  Pulmonary/Chest: Effort normal and breath sounds normal.  Abdominal: Soft. Normal appearance. There is no tenderness. There is no rigidity and no guarding.  Musculoskeletal: Normal range of motion.  Tenderness palpation noted to the lateral malleolus of the left ankle that extends to the distal tib-fib.  There is some overlying erythema, dry scaly patches, warmth.  No fluctuance.  No drainage.  Tenderness palpation noted to the left tib-fib.  No deformity or crepitus noted.  Tenderness palpation noted to the anterior aspect of the left knee with  some mild overlying soft tissue swelling.  No overlying warmth, erythema.  Flexion/extension intact without any difficulty.  Negative posterior and anterior drawer test.  No instability noted on varus or valgus stress.  Flexion/extension of left hip intact without any difficulty.  Internal and external rotation of left hip able to be completed without any difficulty.  No tenderness palpation noted to the left hip bone.  No deformity or crepitus noted.  Right lower extremity is without any abnormalities.  Neurological: He is alert.  Sensation intact along major nerve distributions of BLE  Skin: Skin is  warm and dry. Capillary refill takes less than 2 seconds. There is erythema.     Good distal cap refill. LLE is not dusky in appearance or cool to touch.   Psychiatric: He has a normal mood and affect. His speech is normal.  Nursing note and vitals reviewed.    ED Treatments / Results  Labs (all labs ordered are listed, but only abnormal results are displayed) Labs Reviewed  CBC WITH DIFFERENTIAL/PLATELET - Abnormal; Notable for the following components:      Result Value   RBC 3.97 (*)    HCT 38.5 (*)    Monocytes Absolute 1.2 (*)    All other components within normal limits  BASIC METABOLIC PANEL - Abnormal; Notable for the following components:   Sodium 133 (*)    Chloride 99 (*)    Glucose, Bld 128 (*)    BUN 21 (*)    Creatinine, Ser 1.43 (*)    GFR calc non Af Amer 43 (*)    GFR calc Af Amer 50 (*)    All other components within normal limits    EKG None  Radiology Dg Tibia/fibula Left  Result Date: 08/19/2017 CLINICAL DATA:  Left lower extremity swelling, erythema and pain. No reported injury. EXAM: LEFT TIBIA AND FIBULA - 2 VIEW COMPARISON:  None. FINDINGS: No fracture. No suspicious focal osseous lesion. No cortical erosions or periosteal reaction. Surgical clips are noted medial to the distal left tibia. No malalignment at the left knee or left ankle. Mild  tricompartmental left knee osteoarthritis. Mild soft tissue swelling in the distal left lower extremity and left ankle. IMPRESSION: No fracture.  No specific radiographic findings of osteomyelitis. Electronically Signed   By: Delbert Phenix M.D.   On: 08/19/2017 14:01   Dg Ankle Complete Left  Result Date: 08/19/2017 CLINICAL DATA:  Left lower extremity pain, erythema and swelling. No reported injury. EXAM: LEFT ANKLE COMPLETE - 3+ VIEW COMPARISON:  None. FINDINGS: Diffuse soft tissue swelling. Surgical clips are noted medial to the distal left tibia. No fracture. No subluxation. No suspicious focal osseous lesion. No cortical erosions or periosteal reaction. Vascular calcifications throughout the soft tissues. IMPRESSION: Diffuse soft tissue swelling. No fracture or subluxation in the left ankle. No specific radiographic findings of osteomyelitis. Electronically Signed   By: Delbert Phenix M.D.   On: 08/19/2017 13:59   US Venous Img Lower Unilateral Left  Result Date: 08/19/2017 CLINICAL DATA:  Left lower extremity pain and edema. EXAM: LEFT LOWER EXTREMITY VENOUS DOPPLER ULTRASOUND TECHNIQUE: Gray-scale sonography with graded compression, as well as color Doppler and duplex ultrasound were performed to evaluate the lower extremity deep venous systems from the level of the common femoral vein and including the common femoral, femoral, profunda femoral, popliteal and calf veins including the posterior tibial, peroneal and gastrocnemius veins when visible. The superficial great saphenous vein was also interrogated. Spectral Doppler was utilized to evaluate flow at rest and with distal augmentation maneuvers in the common femoral, femoral and popliteal veins. COMPARISON:  None. FINDINGS: Contralateral Common Femoral Vein: Respiratory phasicity is normal and symmetric with the symptomatic side. No evidence of thrombus. Normal compressibility. Common Femoral Vein: No evidence of thrombus. Normal compressibility,  respiratory phasicity and response to augmentation. Saphenofemoral Junction: No evidence of thrombus. Normal compressibility and flow on color Doppler imaging. Profunda Femoral Vein: No evidence of thrombus. Normal compressibility and flow on color Doppler imaging. Femoral Vein: No evidence of thrombus. Normal compressibility, respiratory phasicity and response to augmentation. Popliteal  Vein: No evidence of thrombus. Normal compressibility, respiratory phasicity and response to augmentation. Calf Veins: No evidence of thrombus. Normal compressibility and flow on color Doppler imaging. Superficial Great Saphenous Vein: No evidence of thrombus. Normal compressibility. Venous Reflux:  None. Other Findings: No evidence of superficial thrombophlebitis or abnormal fluid collection. IMPRESSION: No evidence of left lower extremity deep venous thrombosis. Electronically Signed   By: Irish Lack M.D.   On: 08/19/2017 14:40   Dg Knee Complete 4 Views Left  Result Date: 08/19/2017 CLINICAL DATA:  Left lower extremity, erythema and pain. No reported injury. EXAM: LEFT KNEE - COMPLETE 4+ VIEW COMPARISON:  None. FINDINGS: No fracture, dislocation or joint effusion. No suspicious focal osseous lesions. No osseous erosions. Mild tricompartmental left knee osteoarthritis, most prominent in the medial and patellofemoral compartments. IMPRESSION: Mild tricompartmental left knee osteoarthritis. No fracture, joint effusion or malalignment. Electronically Signed   By: Delbert Phenix M.D.   On: 08/19/2017 14:04    Procedures Procedures (including critical care time)  Medications Ordered in ED Medications  HYDROcodone-acetaminophen (NORCO/VICODIN) 5-325 MG per tablet 1 tablet (1 tablet Oral Given 08/19/17 1529)     Initial Impression / Assessment and Plan / ED Course  I have reviewed the triage vital signs and the nursing notes.  Pertinent labs & imaging results that were available during my care of the patient were  reviewed by me and considered in my medical decision making (see chart for details).     84 year old male with past medical history of dementia brought in by EMS for evaluation of left knee, ankle pain and swelling.  Patient does not recall any injury.  Difficult history secondary to patient's underlying dementia. Patient is afebrile, non-toxic appearing, sitting comfortably on examination table. Vital signs reviewed and stable.  Good distal pulses of left lower extremity.  Good cap refill.  Left lower extremity is not dusky in appearance are cool to touch.  He does have an diffuse area of erythema with overlying dressing dry scaly patches noted to the lateral aspect of the distal tib-fib.  Consider cellulitis.  Patient with tenderness palpation noted to the ankle, tib-fib and knee with some overlying soft tissue swelling.  Consider fracture versus dislocation although patient denies any recent trauma, injury.  Do not suspect hip fracture as patient is able to move the left hip without any difficulty.  No shortening or rotation of the leg.  Low suspicion for DVT but also a consideration.  History/physical exam is not concerning for acute arterial embolism, septic arthritis.  Plan to check basic labs, x-ray.  Discussed with patient's daughter, Lindell Noe, regarding patient's symptoms.  Patient lives at a nursing care facility with his wife.  He has a baseline history of dementia.  She reports that he has been having intermittent pain in his left lower extremity for the last few days and states that this morning at 4 AM, he woke up his wife stating that he was having spasming pain in his left lower extremity.  Daughter does report the patient has had an area of redness and dry skin noted to the lateral aspect of the left lower extremity which correlates on my exam.  She states that she does not know of any fevers.  States she does not know of any trauma, injury, fall.    X-rays are negative for any acute  abnormality.  Ultrasound is negative for any DVT. Given  Area of redness on the lateral aspect of the leg, will plan to  treat as cellulitis.  Will start patient on antibiotic therapy.  Patient with no known drug allergies.  Will discharge to send patient back to nursing home facility.     Final Clinical Impressions(s) / ED Diagnoses   Final diagnoses:  Left leg pain  Cellulitis of left lower extremity    ED Discharge Orders        Ordered    cephALEXin (KEFLEX) 500 MG capsule  2 times daily     08/19/17 1513    HYDROcodone-acetaminophen (NORCO/VICODIN) 5-325 MG tablet  Every 6 hours PRN     08/19/17 1513    Skin Protectants, Misc. (WHITE PETROLATUM-ZINC) 58.3 % cream  3 times daily PRN     08/19/17 1513       Maxwell Caul, PA-C 08/19/17 2244    Gwyneth Sprout, MD 08/22/17 1436

## 2017-10-29 ENCOUNTER — Emergency Department (HOSPITAL_COMMUNITY): Payer: Medicare Other

## 2017-10-29 ENCOUNTER — Encounter (HOSPITAL_COMMUNITY): Payer: Self-pay | Admitting: Emergency Medicine

## 2017-10-29 ENCOUNTER — Other Ambulatory Visit: Payer: Self-pay

## 2017-10-29 ENCOUNTER — Inpatient Hospital Stay (HOSPITAL_COMMUNITY)
Admission: EM | Admit: 2017-10-29 | Discharge: 2017-11-02 | DRG: 378 | Disposition: A | Discharge status: Skilled Nursing Facility | Payer: Medicare Other | Attending: Internal Medicine | Admitting: Internal Medicine

## 2017-10-29 DIAGNOSIS — Z951 Presence of aortocoronary bypass graft: Secondary | ICD-10-CM

## 2017-10-29 DIAGNOSIS — N179 Acute kidney failure, unspecified: Secondary | ICD-10-CM | POA: Diagnosis not present

## 2017-10-29 DIAGNOSIS — K625 Hemorrhage of anus and rectum: Secondary | ICD-10-CM | POA: Diagnosis not present

## 2017-10-29 DIAGNOSIS — Z79899 Other long term (current) drug therapy: Secondary | ICD-10-CM

## 2017-10-29 DIAGNOSIS — L98419 Non-pressure chronic ulcer of buttock with unspecified severity: Secondary | ICD-10-CM | POA: Diagnosis present

## 2017-10-29 DIAGNOSIS — K644 Residual hemorrhoidal skin tags: Secondary | ICD-10-CM | POA: Diagnosis present

## 2017-10-29 DIAGNOSIS — I25810 Atherosclerosis of coronary artery bypass graft(s) without angina pectoris: Secondary | ICD-10-CM | POA: Diagnosis not present

## 2017-10-29 DIAGNOSIS — E039 Hypothyroidism, unspecified: Secondary | ICD-10-CM | POA: Diagnosis present

## 2017-10-29 DIAGNOSIS — F0391 Unspecified dementia with behavioral disturbance: Secondary | ICD-10-CM | POA: Diagnosis not present

## 2017-10-29 DIAGNOSIS — Z950 Presence of cardiac pacemaker: Secondary | ICD-10-CM

## 2017-10-29 DIAGNOSIS — G9340 Encephalopathy, unspecified: Secondary | ICD-10-CM | POA: Diagnosis not present

## 2017-10-29 DIAGNOSIS — E1122 Type 2 diabetes mellitus with diabetic chronic kidney disease: Secondary | ICD-10-CM | POA: Diagnosis present

## 2017-10-29 DIAGNOSIS — F0280 Dementia in other diseases classified elsewhere without behavioral disturbance: Secondary | ICD-10-CM | POA: Diagnosis present

## 2017-10-29 DIAGNOSIS — I129 Hypertensive chronic kidney disease with stage 1 through stage 4 chronic kidney disease, or unspecified chronic kidney disease: Secondary | ICD-10-CM | POA: Diagnosis present

## 2017-10-29 DIAGNOSIS — I2510 Atherosclerotic heart disease of native coronary artery without angina pectoris: Secondary | ICD-10-CM | POA: Diagnosis present

## 2017-10-29 DIAGNOSIS — I10 Essential (primary) hypertension: Secondary | ICD-10-CM | POA: Diagnosis not present

## 2017-10-29 DIAGNOSIS — F0390 Unspecified dementia without behavioral disturbance: Secondary | ICD-10-CM | POA: Diagnosis present

## 2017-10-29 DIAGNOSIS — L309 Dermatitis, unspecified: Secondary | ICD-10-CM | POA: Diagnosis present

## 2017-10-29 DIAGNOSIS — G309 Alzheimer's disease, unspecified: Secondary | ICD-10-CM | POA: Diagnosis present

## 2017-10-29 DIAGNOSIS — E785 Hyperlipidemia, unspecified: Secondary | ICD-10-CM | POA: Diagnosis present

## 2017-10-29 DIAGNOSIS — K922 Gastrointestinal hemorrhage, unspecified: Secondary | ICD-10-CM | POA: Diagnosis present

## 2017-10-29 DIAGNOSIS — Z7982 Long term (current) use of aspirin: Secondary | ICD-10-CM

## 2017-10-29 DIAGNOSIS — N400 Enlarged prostate without lower urinary tract symptoms: Secondary | ICD-10-CM | POA: Diagnosis present

## 2017-10-29 DIAGNOSIS — L98429 Non-pressure chronic ulcer of back with unspecified severity: Secondary | ICD-10-CM | POA: Diagnosis present

## 2017-10-29 DIAGNOSIS — N183 Chronic kidney disease, stage 3 (moderate): Secondary | ICD-10-CM | POA: Diagnosis present

## 2017-10-29 LAB — CBC
HCT: 45.5 % (ref 39.0–52.0)
Hemoglobin: 14.5 g/dL (ref 13.0–17.0)
MCH: 31.3 pg (ref 26.0–34.0)
MCHC: 31.9 g/dL (ref 30.0–36.0)
MCV: 98.3 fL (ref 78.0–100.0)
Platelets: 219 10*3/uL (ref 150–400)
RBC: 4.63 MIL/uL (ref 4.22–5.81)
RDW: 13.4 % (ref 11.5–15.5)
WBC: 8.4 10*3/uL (ref 4.0–10.5)

## 2017-10-29 LAB — TYPE AND SCREEN
ABO/RH(D): O POS
ANTIBODY SCREEN: NEGATIVE

## 2017-10-29 LAB — COMPREHENSIVE METABOLIC PANEL
ALK PHOS: 95 U/L (ref 38–126)
ALT: 29 U/L (ref 0–44)
AST: 27 U/L (ref 15–41)
Albumin: 3.7 g/dL (ref 3.5–5.0)
Anion gap: 8 (ref 5–15)
BILIRUBIN TOTAL: 0.6 mg/dL (ref 0.3–1.2)
BUN: 20 mg/dL (ref 8–23)
CALCIUM: 9.8 mg/dL (ref 8.9–10.3)
CO2: 27 mmol/L (ref 22–32)
Chloride: 102 mmol/L (ref 98–111)
Creatinine, Ser: 1.51 mg/dL — ABNORMAL HIGH (ref 0.61–1.24)
GFR calc Af Amer: 46 mL/min — ABNORMAL LOW (ref >60)
GFR calc non Af Amer: 40 mL/min — ABNORMAL LOW (ref >60)
Glucose, Bld: 135 mg/dL — ABNORMAL HIGH (ref 70–99)
Potassium: 4.3 mmol/L (ref 3.5–5.1)
SODIUM: 137 mmol/L (ref 135–145)
TOTAL PROTEIN: 6.4 g/dL — AB (ref 6.5–8.1)

## 2017-10-29 LAB — PROTIME-INR
INR: 0.99
Prothrombin Time: 13 seconds (ref 11.4–15.2)

## 2017-10-29 LAB — ABO/RH: ABO/RH(D): O POS

## 2017-10-29 MED ORDER — MIRTAZAPINE 15 MG PO TABS
15.0000 mg | ORAL_TABLET | Freq: Every day | ORAL | Status: DC
  Administered 2017-10-30 – 2017-11-01 (×4): 15 mg via ORAL
  Filled 2017-10-29 (×4): qty 1

## 2017-10-29 MED ORDER — MEMANTINE HCL 10 MG PO TABS
10.0000 mg | ORAL_TABLET | Freq: Two times a day (BID) | ORAL | Status: DC
  Administered 2017-10-30 – 2017-11-02 (×8): 10 mg via ORAL
  Filled 2017-10-29 (×8): qty 1

## 2017-10-29 MED ORDER — IOHEXOL 300 MG/ML  SOLN
80.0000 mL | Freq: Once | INTRAMUSCULAR | Status: AC | PRN
  Administered 2017-10-29: 100 mL via INTRAVENOUS

## 2017-10-29 MED ORDER — DOCUSATE SODIUM 100 MG PO CAPS
100.0000 mg | ORAL_CAPSULE | Freq: Every day | ORAL | Status: DC
  Administered 2017-10-30 – 2017-11-01 (×3): 100 mg via ORAL
  Filled 2017-10-29 (×4): qty 1

## 2017-10-29 MED ORDER — LORAZEPAM 2 MG/ML IJ SOLN
0.5000 mg | Freq: Once | INTRAMUSCULAR | Status: AC
  Administered 2017-10-29: 0.5 mg via INTRAVENOUS
  Filled 2017-10-29: qty 1

## 2017-10-29 MED ORDER — ATORVASTATIN CALCIUM 40 MG PO TABS
40.0000 mg | ORAL_TABLET | Freq: Every day | ORAL | Status: DC
  Administered 2017-10-30 – 2017-11-01 (×4): 40 mg via ORAL
  Filled 2017-10-29 (×4): qty 1

## 2017-10-29 MED ORDER — PROPAFENONE HCL 150 MG PO TABS
150.0000 mg | ORAL_TABLET | Freq: Three times a day (TID) | ORAL | Status: DC
  Administered 2017-10-30 – 2017-11-01 (×10): 150 mg via ORAL
  Filled 2017-10-29 (×12): qty 1

## 2017-10-29 NOTE — ED Notes (Signed)
ED Provider at bedside. 

## 2017-10-29 NOTE — ED Provider Notes (Signed)
[facility] EMERGENCY DEPARTMENT Provider Note   CSN: [id number] Arrival date & time: 10/29/17  1810     History   Chief Complaint Chief Complaint  Patient presents with  . Rectal Bleeding    HPI Kenneth Hampton is a 84 y.o. male.  HPI Kenneth Hampton is a 85 y.o. male with history of hypertension, diabetes, coronary artery disease, dementia, presents to emergency department with complaint of rectal bleeding.  Patient  is demented, unable to provide any history.  According to the family, patient is coming from and nursing facility.  Patient has pressure ulcers to the buttocks, which they have been treating with topical creams.  Wife states that she noticed that the wound started bleeding today.  Patient with dried blood in his underwear.  No other complaints.  Past Medical History:  Diagnosis Date  . Atherosclerotic heart disease   . Dementia   . Diabetes mellitus without complication (HCC)   . Hyperlipidemia   . Hypertension     There are no active problems to display for this patient.   Past Surgical History:  Procedure Laterality Date  . CATARACT EXTRACTION, BILATERAL    . OTHER SURGICAL HISTORY Left    Hareware arm   . PACEMAKER IMPLANT          Home Medications    Prior to Admission medications   Medication Sig Start Date End Date Taking? Authorizing Provider  acetaminophen (TYLENOL) 500 MG tablet Take 2 tablets (1,000 mg total) by mouth every 6 (six) hours as needed for moderate pain. 11/20/16   Long, Arlyss Repress, MD  Ascorbic Acid (VITAMIN C) 100 MG tablet Take 100 mg by mouth daily.    [provider]  aspirin EC 81 MG tablet Take 81 mg by mouth daily.    [provider]  atorvastatin (LIPITOR) 40 MG tablet Take 40 mg by mouth daily.    [provider]  docusate sodium (COLACE) 100 MG capsule Take 100 mg by mouth 2 (two) times daily.    [provider]  donepezil (ARICEPT) 10 MG tablet Take 10 mg by mouth at  bedtime.    [provider]  ferrous sulfate 325 (65 FE) MG EC tablet Take 325 mg by mouth 3 (three) times daily with meals.    [provider]  furosemide (LASIX) 20 MG tablet Take 20 mg by mouth.    [provider]  guaifenesin (ROBITUSSIN) 100 MG/5ML syrup Take 200 mg by mouth 3 (three) times daily as needed for cough.    [provider]  HYDROcodone-acetaminophen (NORCO/VICODIN) 5-325 MG tablet Take 1-2 tablets by mouth every 6 (six) hours as needed. 08/19/17   Maxwell Caul, PA-C  levothyroxine (SYNTHROID, LEVOTHROID) 25 MCG tablet Take 25 mcg by mouth daily before breakfast.    [provider]  lidocaine (LIDODERM) 5 % Place 1 patch onto the skin daily. Remove & Discard patch within 12 hours or as directed by MD 11/20/16   Long, Arlyss Repress, MD  lisinopril (PRINIVIL,ZESTRIL) 5 MG tablet Take 5 mg by mouth daily.    [provider]  loperamide (IMODIUM A-D) 2 MG tablet Take 2 mg by mouth 4 (four) times daily as needed for diarrhea or loose stools.    [provider]  loratadine (CLARITIN) 10 MG tablet Take 10 mg by mouth daily.    [provider]  LORazepam (ATIVAN) 0.5 MG tablet Take 0.5 mg by mouth 3 (three) times daily.  [provider]  losartan (COZAAR) 25 MG tablet Take 25 mg by mouth daily.    [provider]  memantine (NAMENDA) 10 MG tablet Take 10 mg by mouth 2 (two) times daily.    [provider]  omeprazole (PRILOSEC) 20 MG capsule Take 20 mg by mouth daily.    [provider]  polyethylene glycol (MIRALAX / GLYCOLAX) packet Take 17 g by mouth daily.    [provider]  propafenone (RYTHMOL) 150 MG tablet Take 150 mg by mouth every 8 (eight) hours.    [provider]  Skin Protectants, Misc. (WHITE PETROLATUM-ZINC) 58.3 % cream Apply topically 3 (three) times daily as needed. 08/19/17   Maxwell Caul, PA-C  tamsulosin (FLOMAX) 0.4 MG CAPS capsule Take 0.4  mg by mouth.    [provider]    Family History No family history on file.  Social History Social History   Tobacco Use  . Smoking status: Never Smoker  . Smokeless tobacco: Never Used  Substance Use Topics  . Alcohol use: Not Currently  . Drug use: Not Currently     Allergies   Patient has no known allergies.   Review of Systems Review of Systems  Unable to perform ROS: Dementia  Gastrointestinal: Positive for anal bleeding.     Physical Exam Updated Vital Signs BP (!) 118/57 (BP Location: Right Arm)   Pulse 62   Temp 97.9 F (36.6 C) (Oral)   Resp 18   Ht 5\' 4"  (1.626 m)   Wt 88.9 kg (196 lb)   SpO2 96%   BMI 33.64 kg/m   Physical Exam  Constitutional: He appears well-developed and well-nourished. No distress.  HENT:  Head: Normocephalic and atraumatic.  Eyes: Conjunctivae are normal.  Neck: Neck supple.  Cardiovascular: Normal rate, regular rhythm and normal heart sounds.  Pulmonary/Chest: Effort normal. No respiratory distress. He has no wheezes. He has no rales.  Abdominal: Soft. Bowel sounds are normal. He exhibits no distension. There is tenderness. There is no rebound.  Tenderness to the left lower quadrant  Genitourinary:  Genitourinary Comments: There is skin breakdown surrounding anus bilaterally.  No cellulitic changes.  Wounds do not appear to be bleeding at this time.  There is dried blood around the rectum and anus.  Dark blood noted at the opening to the anus.  External hemorrhoids present.  Nonthrombosed.  Musculoskeletal: He exhibits no edema.  Neurological: He is alert.  Skin: Skin is warm and dry.  Nursing note and vitals reviewed.    ED Treatments / Results  Labs (all labs ordered are listed, but only abnormal results are displayed) Labs Reviewed  COMPREHENSIVE METABOLIC PANEL - Abnormal; Notable for the following components:      Result Value   Glucose, Bld 135 (*)    Creatinine, Ser 1.51 (*)    Total Protein 6.4  (*)    GFR calc non Af Amer 40 (*)    GFR calc Af Amer 46 (*)    All other components within normal limits  CBC  PROTIME-INR  POC OCCULT BLOOD, ED  TYPE AND SCREEN  ABO/RH    EKG EKG Interpretation  Date/Time:  Monday October 29 2017 20:13:24 EDT Ventricular Rate:  63 PR Interval:    QRS Duration: 203 QT Interval:  487 QTC Calculation: 499 R Axis:   -86 Text Interpretation:  Electronic ventricular pacemaker IVCD, consider atypical RBBB Baseline wander in lead(s) V2 Confirmed by Linwood Dibbles [phone]) on 10/29/2017 9:26:49 PM  Radiology Ct Abdomen Pelvis W Contrast  Result Date: 10/29/2017 CLINICAL DATA:  Rectal bleeding.  Perianal rash. EXAM: CT ABDOMEN AND PELVIS WITH CONTRAST TECHNIQUE: Multidetector CT imaging of the abdomen and pelvis was performed using the standard protocol following bolus administration of intravenous contrast. CONTRAST:  OMNIPAQUE IOHEXOL 300 MG/ML  SOLN COMPARISON:  None. FINDINGS: Despite efforts by the technologist and patient, motion artifact is present on today's exam and could not be eliminated. This reduces exam sensitivity and specificity. Lower chest: Scarring or atelectasis in both lower lobes. Pacer leads noted. Coronary atherosclerosis. Aortic valve calcification. Hepatobiliary: Cholecystectomy. Nonspecific 1.2 by 1.0 cm hypodense lesion in segment IVb of the liver, image 25/3. This hypodensity is just above the gallbladder fossa. No biliary dilatation. Pancreas: Unremarkable Spleen: Unremarkable Adrenals/Urinary Tract: Scarring anteriorly in the left kidney. The adrenal glands appear normal. No urinary tract calculi are identified. Stomach/Bowel: Postoperative findings in the right colon. Otherwise unremarkable. No specific rectal or visualized perianal abnormality. Vascular/Lymphatic: Aortoiliac atherosclerotic vascular disease. No adenopathy. Reproductive: Unremarkable Other: No supplemental non-categorized findings. Musculoskeletal: 8 mm of  degenerative anterolisthesis at L4-5 without pars defects. Lumbar spondylosis and degenerative disc disease causing multilevel impingement at all levels between L1 and S1. Lower thoracic spondylosis. Right total hip prosthesis. IMPRESSION: 1. No specific rectal or anal abnormality is identified based on imaging. The region is partially obscured by streak artifact from the patient's right hip implant. 2. Nonspecific small hypodense lesion in the left hepatic lobe just above the gallbladder fossa, probably a cyst or volume averaging of part of the gallbladder fossa, but technically nonspecific. 3. Aortic Atherosclerosis (ICD10-I70.0). Coronary atherosclerosis with aortic valve calcification. 4. Scarring in the left kidney. 5. Postoperative findings in the right colon. 6. Lumbar spondylosis and degenerative disc disease causing multilevel impingement. Electronically Signed   By: Gaylyn Rong M.D.   On: 10/29/2017 23:10    Procedures Procedures (including critical care time)  Medications Ordered in ED Medications - No data to display   Initial Impression / Assessment and Plan / ED Course  I have reviewed the triage vital signs and the nursing notes.  Pertinent labs & imaging results that were available during my care of the patient were reviewed by me and considered in my medical decision making (see chart for details).     Patient with bleeding which appears to come from the rectum rather than from the wounds to his bottom.  No history of the same.  Patient does have external hemorrhoids.  He is however tender in the left lower quadrant as well.  He is a poor historian due to his dementia.  Will get labs, type and screen, CT abdomen and pelvis.  CT with no acute findings.  Patient is pretty agitated, Medicated with 0.5 mg of Ativan earlier, I will give him another dose.  I also ordered his evening medications, family is pretty upset because he missed those and think that is why his agitation  is worse.  Patient is screaming and pulling on things while in the room.  Patient also does take Ativan 3 times daily.   Vital signs have been stable.   Vitals:   10/29/17 2100 10/29/17 2145 10/29/17 2215 10/29/17 2300  BP: 136/64 138/65 134/79   Pulse: (!) 56 (!) 59 (!) 50 65  Resp: 16 19 15 19   Temp:      TempSrc:      SpO2: 99% 93% 96% 100%  Weight:      Height:  I spoke with hospitalist, will admit patient.  Patient is FULL CODE.     Final Clinical Impressions(s) / ED Diagnoses   Final diagnoses:  Rectal bleeding    ED Discharge Orders    None       Jaynie CrumbleKirichenko, Carie Kapuscinski, PA-C 10/30/17 Erlene [id number]    Knapp, Jon, MD 10/30/17 1553

## 2017-10-29 NOTE — ED Provider Notes (Signed)
Patient placed in Quick Look pathway, seen and evaluated   Chief Complaint: Rectal bleeding  HPI:   84 year old male presenting from [facility] in [place] for rectal bleeding. The patient's wife reports the patient has bleeding from a rash around his anus and was told to bring the patient for evaluation.   ROS: rectal bleeding (one)  Physical Exam:   Gen: No distress  Neuro: Awake and Alert  Skin: Warm    Focused Exam: Chaperoned exam. Dried blood is noted to the bilateral buttocks. There is some dried blood around the introitus of the anus, which appears to be the source. Minimal skin breakdown to the bilateral buttocks.    Initiation of care has begun. The patient has been counseled on the process, plan, and necessity for staying for the completion/evaluation, and the remainder of the medical screening examination    Barkley BoardsMcDonald, Madeliene Tejera A, PA-C 10/29/17 1924    Gwyneth SproutPlunkett, Whitney, MD 10/30/17 [phone]

## 2017-10-29 NOTE — ED Triage Notes (Signed)
Upon further assessment pt has bleeding from rectum. Family reports significant amount of blood in restroom today.

## 2017-10-29 NOTE — ED Triage Notes (Signed)
Pt arrived with family from [facility] [place]. Family states that he has hx of skin condition and that he currently has 2 wounds on his bottom that opened up and started bleeding today so they came here.

## 2017-10-29 NOTE — H&P (Signed)
History and Physical    Kenneth Hampton:[id number] DOB: 09-25-1928 DOA: 10/29/2017  Referring MD/NP/PA: Erline Levine PCP: Kenneth Chessman, MD  Patient coming from: [facility] via EMS  Chief Complaint: Rectal bleeding  I have personally briefly reviewed patient's old medical records in [facility]   HPI: Kenneth Hampton is a 84 y.o. male with medical history significant of HTN. HLD, CAD s/p CABG in 1996, dementia, hypothyroidism, s/p PM; who presents with complaints of rectal bleeding.  History is obtained from the patient's wife as he has significant dementia.  At baseline patient is mostly sedentary and ambulates with the use of a walker.  Patient was noted to have rectal bleeding today.  Patient's wife initially thought it was from his pressure ulcers near his rectum.  They have been treating these topical creams.  He previously had colon surgery with removal of 18 inches of descending colon after embedded polyp was found back in 2008.  Patient has never had any issues with bleeding in the past.   ED Course: Patient into the emergency department patient was noted to be afebrile, pulse 50-69, and all other vitals signs maintained.  Labs revealed hemoglobin 14.5, BUN 20, creatinine 1.51, and INR 0.99.  On physical exam patient was reported to rectal sores with external hemorrhoids and maroon-colored stool present.  Patient was noted to be te have a tender abdomen for which CT imaging of the abdomen and pelvis did not show any acute abnormalities.  [facility] called to admit for observation overnight for suspected bleed.  Review of Systems  Unable to perform ROS: Dementia    Past Medical History:  Diagnosis Date  . Atherosclerotic heart disease   . Dementia   . Diabetes mellitus without complication (HCC)   . Hyperlipidemia   . Hypertension     Past Surgical History:  Procedure Laterality Date  . CATARACT EXTRACTION, BILATERAL    . OTHER SURGICAL HISTORY Left    Hareware arm   . PACEMAKER IMPLANT       reports that he has never smoked. He has never used smokeless tobacco. He reports that he drank alcohol. He reports that he has current or past drug history.  No Known Allergies  No family history on file.  Prior to Admission medications   Medication Sig Start Date End Date Taking? Authorizing Provider  Ascorbic Acid (VITAMIN C) 100 MG tablet Take 100 mg by mouth at bedtime.    Yes [provider]  aspirin EC 81 MG tablet Take 81 mg by mouth daily.   Yes [provider]  atorvastatin (LIPITOR) 40 MG tablet Take 40 mg by mouth at bedtime.    Yes [provider]  Cholecalciferol 2000 units TABS Take 2,000 Units by mouth daily.   Yes [provider]  docusate sodium (COLACE) 100 MG capsule Take 100 mg by mouth at bedtime.    Yes [provider]  donepezil (ARICEPT) 10 MG tablet Take 10 mg by mouth daily.    Yes [provider]  ferrous sulfate 325 (65 FE) MG EC tablet Take 325 mg by mouth daily with breakfast.    Yes [provider]  furosemide (LASIX) 20 MG tablet Take 20 mg by mouth daily.    Yes [provider]  HYDROcodone-acetaminophen (NORCO/VICODIN) 5-325 MG tablet Take 1-2 tablets by mouth every 6 (six) hours as needed. 08/19/17  Yes Maxwell Caul, PA-C  Hypromellose (ARTIFICIAL TEARS) 0.4 % SOLN Place 1 drop into  both eyes 3 (three) times daily.   Yes [provider]  ketoconazole (NIZORAL) 2 % cream Apply 1 application topically See admin instructions. Twice daily on Monday and Friday   Yes [provider]  ketoconazole (NIZORAL) 2 % shampoo Apply 1 application topically 2 (two) times a week. On Monday and Thursday   Yes [provider]  levothyroxine (SYNTHROID, LEVOTHROID) 50 MCG tablet Take 50 mcg by mouth daily before breakfast.   Yes [provider]  lisinopril (PRINIVIL,ZESTRIL) 5 MG tablet Take 5 mg by mouth daily.   Yes  [provider]  loratadine (CLARITIN) 10 MG tablet Take 10 mg by mouth at bedtime.    Yes [provider]  LORazepam (ATIVAN) 0.5 MG tablet Take 0.5 mg by mouth 2 (two) times daily.    Yes [provider]  memantine (NAMENDA) 10 MG tablet Take 10 mg by mouth 2 (two) times daily.   Yes [provider]  mirtazapine (REMERON) 15 MG tablet Take 15 mg by mouth at bedtime.   Yes [provider]  omeprazole (PRILOSEC) 20 MG capsule Take 20 mg by mouth daily.   Yes [provider]  propafenone (RYTHMOL) 150 MG tablet Take 150 mg by mouth 3 (three) times daily.    Yes [provider]  tamsulosin (FLOMAX) 0.4 MG CAPS capsule Take 0.4 mg by mouth daily.    Yes [provider]  triamcinolone cream (KENALOG) 0.1 % Apply 1 application topically 2 (two) times daily. To legs and arms   Yes [provider]  acetaminophen (TYLENOL) 500 MG tablet Take 2 tablets (1,000 mg total) by mouth every 6 (six) hours as needed for moderate pain. Patient not taking: Reported on 10/29/2017 11/20/16   Long, Arlyss RepressJoshua G, MD  lidocaine (LIDODERM) 5 % Place 1 patch onto the skin daily. Remove & Discard patch within 12 hours or as directed by MD Patient not taking: Reported on 10/29/2017 11/20/16   Long, Arlyss RepressJoshua G, MD  Skin Protectants, Misc. (WHITE PETROLATUM-ZINC) 58.3 % cream Apply topically 3 (three) times daily as needed. Patient not taking: Reported on 10/29/2017 08/19/17   Maxwell CaulLayden, Lindsey A, PA-C    Physical Exam:  Constitutional: Obese elderly male NAD, calm, comfortable Vitals:   10/29/17 2100 10/29/17 2145 10/29/17 2215 10/29/17 2300  BP: 136/64 138/65 134/79   Pulse: (!) 56 (!) 59 (!) 50 65  Resp: 16 19 15 19   Temp:      TempSrc:      SpO2: 99% 93% 96% 100%  Weight:      Height:       Eyes: PERRL, lids and conjunctivae normal ENMT: Mucous membranes are moist. Posterior pharynx clear of any exudate or lesions.   Neck: normal, supple, no  masses, no thyromegaly Respiratory: clear to auscultation bilaterally, no wheezing, no crackles. Normal respiratory effort. No accessory muscle use.  Cardiovascular: Bradycardic, no murmurs / rubs / gallops. No extremity edema. 2+ pedal pulses. No carotid bruits.  Abdomen: no tenderness, no masses palpated. No hepatosplenomegaly. Bowel sounds positive.  Musculoskeletal: no clubbing / cyanosis. No joint deformity upper and lower extremities. Good ROM, no contractures. Normal muscle tone.  Skin: Pressure ulcerations noted at the rectum with dried blood near rectum and  hemorrhoids present. Neurologic: CN 2-12 grossly intact. Sensation intact, DTR normal. Strength 5/5 in all 4.  Psychiatric: Disoriented, history of dementia    Labs on Admission: I have personally reviewed following labs and imaging studies  CBC: Recent Labs  Lab 10/29/17 1939  WBC 8.4  HGB 14.5  HCT 45.5  MCV 98.3  PLT 219   Basic Metabolic Panel: Recent Labs  Lab 10/29/17 1939  NA 137  K 4.3  CL 102  CO2 27  GLUCOSE 135*  BUN 20  CREATININE 1.51*  CALCIUM 9.8   GFR: Estimated Creatinine Clearance: 34.7 mL/min (A) (by C-G formula based on SCr of 1.51 mg/dL (H)). Liver Function Tests: Recent Labs  Lab 10/29/17 1939  AST 27  ALT 29  ALKPHOS 95  BILITOT 0.6  PROT 6.4*  ALBUMIN 3.7   No results for input(s): LIPASE, AMYLASE in the last 168 hours. No results for input(s): AMMONIA in the last 168 hours. Coagulation Profile: Recent Labs  Lab 10/29/17 2022  INR 0.99   Cardiac Enzymes: No results for input(s): CKTOTAL, CKMB, CKMBINDEX, TROPONINI in the last 168 hours. BNP (last 3 results) No results for input(s): PROBNP in the last 8760 hours. HbA1C: No results for input(s): HGBA1C in the last 72 hours. CBG: No results for input(s): GLUCAP in the last 168 hours. Lipid Profile: No results for input(s): CHOL, HDL, LDLCALC, TRIG, CHOLHDL, LDLDIRECT in the last 72 hours. Thyroid Function Tests: No  results for input(s): TSH, T4TOTAL, FREET4, T3FREE, THYROIDAB in the last 72 hours. Anemia Panel: No results for input(s): VITAMINB12, FOLATE, FERRITIN, TIBC, IRON, RETICCTPCT in the last 72 hours. Urine analysis:    Component Value Date/Time   COLORURINE YELLOW 11/20/2016 0556   APPEARANCEUR CLEAR 11/20/2016 0556   LABSPEC 1.006 11/20/2016 0556   PHURINE 7.5 11/20/2016 0556   GLUCOSEU NEGATIVE 11/20/2016 0556   HGBUR NEGATIVE 11/20/2016 0556   BILIRUBINUR NEGATIVE 11/20/2016 0556   KETONESUR NEGATIVE 11/20/2016 0556   PROTEINUR NEGATIVE 11/20/2016 0556   NITRITE NEGATIVE 11/20/2016 0556   LEUKOCYTESUR NEGATIVE 11/20/2016 0556   Sepsis Labs: No results found for this or any previous visit (from the past 240 hour(s)).   Radiological Exams on Admission: Ct Abdomen Pelvis W Contrast  Result Date: 10/29/2017 CLINICAL DATA:  Rectal bleeding.  Perianal rash. EXAM: CT ABDOMEN AND PELVIS WITH CONTRAST TECHNIQUE: Multidetector CT imaging of the abdomen and pelvis was performed using the standard protocol following bolus administration of intravenous contrast. CONTRAST:  OMNIPAQUE IOHEXOL 300 MG/ML  SOLN COMPARISON:  None. FINDINGS: Despite efforts by the technologist and patient, motion artifact is present on today's exam and could not be eliminated. This reduces exam sensitivity and specificity. Lower chest: Scarring or atelectasis in both lower lobes. Pacer leads noted. Coronary atherosclerosis. Aortic valve calcification. Hepatobiliary: Cholecystectomy. Nonspecific 1.2 by 1.0 cm hypodense lesion in segment IVb of the liver, image 25/3. This hypodensity is just above the gallbladder fossa. No biliary dilatation. Pancreas: Unremarkable Spleen: Unremarkable Adrenals/Urinary Tract: Scarring anteriorly in the left kidney. The adrenal glands appear normal. No urinary tract calculi are identified. Stomach/Bowel: Postoperative findings in the right colon. Otherwise unremarkable. No specific rectal or  visualized perianal abnormality. Vascular/Lymphatic: Aortoiliac atherosclerotic vascular disease. No adenopathy. Reproductive: Unremarkable Other: No supplemental non-categorized findings. Musculoskeletal: 8 mm of degenerative anterolisthesis at L4-5 without pars defects. Lumbar spondylosis and degenerative disc disease causing multilevel impingement at all levels between L1 and S1. Lower thoracic spondylosis. Right total hip prosthesis. IMPRESSION: 1. No specific rectal or anal abnormality is identified based on imaging. The region is partially obscured by streak artifact from the patient's right hip implant. 2. Nonspecific small hypodense lesion in the left hepatic lobe just above the gallbladder fossa, probably a cyst or volume averaging  of part of the gallbladder fossa, but technically nonspecific. 3. Aortic Atherosclerosis (ICD10-I70.0). Coronary atherosclerosis with aortic valve calcification. 4. Scarring in the left kidney. 5. Postoperative findings in the right colon. 6. Lumbar spondylosis and degenerative disc disease causing multilevel impingement. Electronically Signed   By: Gaylyn Rong M.D.   On: 10/29/2017 23:10    EKG: Independently reviewed.  Paced rhythm  Assessment/Plan Rectal bleeding, pressure ulcerations: Acute.  Patient with rectal pressure ulcers and because of external hemorrhoids.  Initial hemoglobin stable at 14.5.  Question exact cause of bleeding at this time. - Admit to a MedSurg bed - Monitor blood counts - Gentle IV fluids at 75 mL/h - Transfuse blood products as needed -Low-air-loss mattress - Wound care consult - Will need to consult gastroenterology in a.m.  Dementia: Stable at baseline patient has severe dementia and per family is confused at baseline - Continue Aricept, Namenda, Ativan   CAD: Patient with previous history of two-vessel CABG in 1996. - Hold aspirin   Essential hypertension - Continue home blood pressure medication  Status post  pacemaker - Continue propafenone  Chronic kidney disease stage III: Stable. - Continue to monitor  Hypothyroidism  - continue levothyroxine  Hyperlipidemia - Continue atorvastatin  BPH -Continue Flomax  DVT prophylaxis: SCDs  Code Status: Full  Family Communication: Discussed plan of care with the patient family present at bedside Disposition Plan: Likely discharge home if medically stable Consults called:   Admission status: observation  Clydie Braun MD [facility] Pager [phone]   If 7PM-7AM, please contact night-coverage [email or web address] Password [email or web address]  10/29/2017, 11:51 PM

## 2017-10-29 NOTE — ED Notes (Signed)
Patient transported to CT 

## 2017-10-30 ENCOUNTER — Encounter (HOSPITAL_COMMUNITY): Payer: Self-pay | Admitting: Internal Medicine

## 2017-10-30 DIAGNOSIS — K922 Gastrointestinal hemorrhage, unspecified: Secondary | ICD-10-CM | POA: Diagnosis present

## 2017-10-30 DIAGNOSIS — F0390 Unspecified dementia without behavioral disturbance: Secondary | ICD-10-CM | POA: Diagnosis present

## 2017-10-30 DIAGNOSIS — G9340 Encephalopathy, unspecified: Secondary | ICD-10-CM | POA: Diagnosis not present

## 2017-10-30 DIAGNOSIS — L98429 Non-pressure chronic ulcer of back with unspecified severity: Secondary | ICD-10-CM | POA: Diagnosis present

## 2017-10-30 DIAGNOSIS — L98419 Non-pressure chronic ulcer of buttock with unspecified severity: Secondary | ICD-10-CM | POA: Diagnosis present

## 2017-10-30 DIAGNOSIS — I25810 Atherosclerosis of coronary artery bypass graft(s) without angina pectoris: Secondary | ICD-10-CM | POA: Diagnosis not present

## 2017-10-30 DIAGNOSIS — F0391 Unspecified dementia with behavioral disturbance: Secondary | ICD-10-CM | POA: Diagnosis not present

## 2017-10-30 DIAGNOSIS — K625 Hemorrhage of anus and rectum: Secondary | ICD-10-CM | POA: Diagnosis present

## 2017-10-30 DIAGNOSIS — I129 Hypertensive chronic kidney disease with stage 1 through stage 4 chronic kidney disease, or unspecified chronic kidney disease: Secondary | ICD-10-CM | POA: Diagnosis present

## 2017-10-30 DIAGNOSIS — N183 Chronic kidney disease, stage 3 (moderate): Secondary | ICD-10-CM | POA: Diagnosis present

## 2017-10-30 DIAGNOSIS — F0280 Dementia in other diseases classified elsewhere without behavioral disturbance: Secondary | ICD-10-CM | POA: Diagnosis present

## 2017-10-30 DIAGNOSIS — I2510 Atherosclerotic heart disease of native coronary artery without angina pectoris: Secondary | ICD-10-CM | POA: Diagnosis present

## 2017-10-30 DIAGNOSIS — Z7982 Long term (current) use of aspirin: Secondary | ICD-10-CM | POA: Diagnosis not present

## 2017-10-30 DIAGNOSIS — I10 Essential (primary) hypertension: Secondary | ICD-10-CM | POA: Diagnosis present

## 2017-10-30 DIAGNOSIS — E039 Hypothyroidism, unspecified: Secondary | ICD-10-CM | POA: Diagnosis present

## 2017-10-30 DIAGNOSIS — Z79899 Other long term (current) drug therapy: Secondary | ICD-10-CM | POA: Diagnosis not present

## 2017-10-30 DIAGNOSIS — Z950 Presence of cardiac pacemaker: Secondary | ICD-10-CM | POA: Diagnosis not present

## 2017-10-30 DIAGNOSIS — E785 Hyperlipidemia, unspecified: Secondary | ICD-10-CM | POA: Diagnosis present

## 2017-10-30 DIAGNOSIS — E1122 Type 2 diabetes mellitus with diabetic chronic kidney disease: Secondary | ICD-10-CM | POA: Diagnosis present

## 2017-10-30 DIAGNOSIS — K644 Residual hemorrhoidal skin tags: Secondary | ICD-10-CM | POA: Diagnosis present

## 2017-10-30 DIAGNOSIS — N179 Acute kidney failure, unspecified: Secondary | ICD-10-CM | POA: Diagnosis not present

## 2017-10-30 DIAGNOSIS — G309 Alzheimer's disease, unspecified: Secondary | ICD-10-CM | POA: Diagnosis present

## 2017-10-30 DIAGNOSIS — L309 Dermatitis, unspecified: Secondary | ICD-10-CM | POA: Diagnosis present

## 2017-10-30 DIAGNOSIS — N400 Enlarged prostate without lower urinary tract symptoms: Secondary | ICD-10-CM | POA: Diagnosis present

## 2017-10-30 DIAGNOSIS — Z951 Presence of aortocoronary bypass graft: Secondary | ICD-10-CM | POA: Diagnosis not present

## 2017-10-30 LAB — BASIC METABOLIC PANEL
ANION GAP: 7 (ref 5–15)
BUN: 16 mg/dL (ref 8–23)
CHLORIDE: 106 mmol/L (ref 98–111)
CO2: 28 mmol/L (ref 22–32)
Calcium: 9.5 mg/dL (ref 8.9–10.3)
Creatinine, Ser: 1.3 mg/dL — ABNORMAL HIGH (ref 0.61–1.24)
GFR calc Af Amer: 55 mL/min — ABNORMAL LOW (ref >60)
GFR calc non Af Amer: 48 mL/min — ABNORMAL LOW (ref >60)
GLUCOSE: 126 mg/dL — AB (ref 70–99)
POTASSIUM: 3.7 mmol/L (ref 3.5–5.1)
Sodium: 141 mmol/L (ref 135–145)

## 2017-10-30 LAB — CBC
HEMATOCRIT: 41.2 % (ref 39.0–52.0)
HEMOGLOBIN: 13.2 g/dL (ref 13.0–17.0)
MCH: 31.1 pg (ref 26.0–34.0)
MCHC: 32 g/dL (ref 30.0–36.0)
MCV: 96.9 fL (ref 78.0–100.0)
Platelets: 191 10*3/uL (ref 150–400)
RBC: 4.25 MIL/uL (ref 4.22–5.81)
RDW: 13.3 % (ref 11.5–15.5)
WBC: 9.3 10*3/uL (ref 4.0–10.5)

## 2017-10-30 LAB — OCCULT BLOOD X 1 CARD TO LAB, STOOL: Fecal Occult Bld: POSITIVE — AB

## 2017-10-30 MED ORDER — HALOPERIDOL LACTATE 5 MG/ML IJ SOLN
2.0000 mg | Freq: Once | INTRAMUSCULAR | Status: AC
  Administered 2017-10-30: 2 mg via INTRAVENOUS
  Filled 2017-10-30: qty 1

## 2017-10-30 MED ORDER — TRIAMCINOLONE ACETONIDE 0.1 % EX CREA
1.0000 "application " | TOPICAL_CREAM | Freq: Two times a day (BID) | CUTANEOUS | Status: DC
  Administered 2017-10-30 – 2017-11-02 (×7): 1 via TOPICAL
  Filled 2017-10-30: qty 15

## 2017-10-30 MED ORDER — LORAZEPAM 1 MG PO TABS
0.5000 mg | ORAL_TABLET | Freq: Two times a day (BID) | ORAL | Status: DC
  Administered 2017-10-30 – 2017-11-02 (×7): 0.5 mg via ORAL
  Filled 2017-10-30 (×7): qty 1

## 2017-10-30 MED ORDER — TAMSULOSIN HCL 0.4 MG PO CAPS
0.4000 mg | ORAL_CAPSULE | Freq: Every day | ORAL | Status: DC
  Administered 2017-10-30 – 2017-11-02 (×4): 0.4 mg via ORAL
  Filled 2017-10-30 (×4): qty 1

## 2017-10-30 MED ORDER — LEVOTHYROXINE SODIUM 50 MCG PO TABS
50.0000 ug | ORAL_TABLET | Freq: Every day | ORAL | Status: DC
  Administered 2017-10-30 – 2017-11-02 (×4): 50 ug via ORAL
  Filled 2017-10-30 (×4): qty 1

## 2017-10-30 MED ORDER — ACETAMINOPHEN 325 MG PO TABS
650.0000 mg | ORAL_TABLET | Freq: Four times a day (QID) | ORAL | Status: DC | PRN
Start: 2017-10-30 — End: 2017-11-02
  Administered 2017-10-30: 650 mg via ORAL
  Filled 2017-10-30: qty 2

## 2017-10-30 MED ORDER — DONEPEZIL HCL 10 MG PO TABS
10.0000 mg | ORAL_TABLET | Freq: Every day | ORAL | Status: DC
  Administered 2017-10-30 – 2017-11-02 (×4): 10 mg via ORAL
  Filled 2017-10-30 (×4): qty 1

## 2017-10-30 MED ORDER — LORATADINE 10 MG PO TABS
10.0000 mg | ORAL_TABLET | Freq: Every day | ORAL | Status: DC
  Administered 2017-10-30 – 2017-11-01 (×4): 10 mg via ORAL
  Filled 2017-10-30 (×4): qty 1

## 2017-10-30 MED ORDER — ONDANSETRON HCL 4 MG/2ML IJ SOLN: 4.0000 mg | Freq: Four times a day (QID) | INTRAMUSCULAR | Status: DC | PRN

## 2017-10-30 MED ORDER — FERROUS SULFATE 325 (65 FE) MG PO TABS
325.0000 mg | ORAL_TABLET | Freq: Every day | ORAL | Status: DC
  Administered 2017-10-30 – 2017-11-02 (×4): 325 mg via ORAL
  Filled 2017-10-30 (×4): qty 1

## 2017-10-30 MED ORDER — SODIUM CHLORIDE 0.9 % IV SOLN
INTRAVENOUS | Status: DC
  Administered 2017-10-30 – 2017-11-01 (×6): via INTRAVENOUS

## 2017-10-30 MED ORDER — ACETAMINOPHEN 650 MG RE SUPP: 650.0000 mg | Freq: Four times a day (QID) | RECTAL | Status: DC | PRN

## 2017-10-30 MED ORDER — HYDROCODONE-ACETAMINOPHEN 5-325 MG PO TABS
1.0000 | ORAL_TABLET | Freq: Four times a day (QID) | ORAL | Status: DC | PRN
  Administered 2017-10-30: 1 via ORAL
  Filled 2017-10-30: qty 1

## 2017-10-30 MED ORDER — FUROSEMIDE 20 MG PO TABS
20.0000 mg | ORAL_TABLET | Freq: Every day | ORAL | Status: DC
  Administered 2017-10-30 – 2017-11-01 (×3): 20 mg via ORAL
  Filled 2017-10-30 (×3): qty 1

## 2017-10-30 MED ORDER — LISINOPRIL 5 MG PO TABS
5.0000 mg | ORAL_TABLET | Freq: Every day | ORAL | Status: DC
  Administered 2017-10-30 – 2017-11-02 (×4): 5 mg via ORAL
  Filled 2017-10-30 (×4): qty 1

## 2017-10-30 MED ORDER — ALBUTEROL SULFATE (2.5 MG/3ML) 0.083% IN NEBU: 2.5000 mg | INHALATION_SOLUTION | Freq: Four times a day (QID) | RESPIRATORY_TRACT | Status: DC | PRN

## 2017-10-30 MED ORDER — HYPROMELLOSE (GONIOSCOPIC) 2.5 % OP SOLN
1.0000 [drp] | Freq: Three times a day (TID) | OPHTHALMIC | Status: DC
  Administered 2017-10-30 – 2017-11-02 (×10): 1 [drp] via OPHTHALMIC
  Filled 2017-10-30: qty 15

## 2017-10-30 MED ORDER — ONDANSETRON HCL 4 MG PO TABS: 4.0000 mg | ORAL_TABLET | Freq: Four times a day (QID) | ORAL | Status: DC | PRN

## 2017-10-30 MED ORDER — PANTOPRAZOLE SODIUM 40 MG PO TBEC
40.0000 mg | DELAYED_RELEASE_TABLET | Freq: Every day | ORAL | Status: DC
  Administered 2017-10-30 – 2017-11-02 (×4): 40 mg via ORAL
  Filled 2017-10-30 (×4): qty 1

## 2017-10-30 NOTE — Progress Notes (Addendum)
2100 pt flapping covers at "birds in room," given reg pm meds 2115.  2245 daughter having difficulty keeping pt in bed, tried dark room and music, quiet, distraction with no success.  Paged provider for medication.   2 mg Haldol ordered and given.

## 2017-10-30 NOTE — Consult Note (Signed)
Referring Provider: Dr. Nelson Chimes Primary Care Physician:  Angelica Chessman, MD Primary Gastroenterologist:  N/A  Reason for Consultation:  Rectal bleeding  HPI: Kenneth Hampton is a 84 y.o. male with CAD s/p CABG and advanced dementia who presented to the ED with acute rectal bleeding. Patient unable to provide history. Wife at bedside states that initially she thought it was due to his sacral ulcers but when the patient sat on the toilet he was dripping blood from his rectum. This prompted them to come to the ED. He has never had anything like this before but does have a history of multiple colon polys s/p partial colectomy in [place] several years ago. Since then he was undergoing surveillance colonoscopies every 3 years until 2013 at which point they were told there was no need to continue. His bleeding has since stopped and the wife states she feels things are going well aside from some increased agitation.   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 Shortly after he proceeded to work on the [employer] before going back to working with Uranium.   Denies recent fevers, chills, N/V, change in appetite, new rash, abdominal pain, diarrhea, melena. No current use of NSAIDs or EtOH.   Past Medical History:  Diagnosis Date  . Atherosclerotic heart disease   . Dementia   . Diabetes mellitus without complication (HCC)   . Hyperlipidemia   . Hypertension    Past Surgical History:  Procedure Laterality Date  . CATARACT EXTRACTION, BILATERAL    . OTHER SURGICAL HISTORY Left    Hareware arm   . PACEMAKER IMPLANT     Prior to Admission medications   Medication Sig Start Date End Date Taking? Authorizing Provider  Ascorbic Acid (VITAMIN C) 100 MG tablet Take 100 mg by mouth at bedtime.    Yes [provider]  aspirin EC 81 MG tablet Take 81 mg by mouth daily.   Yes [provider]  atorvastatin  (LIPITOR) 40 MG tablet Take 40 mg by mouth at bedtime.    Yes [provider]  Cholecalciferol 2000 units TABS Take 2,000 Units by mouth daily.   Yes [provider]  docusate sodium (COLACE) 100 MG capsule Take 100 mg by mouth at bedtime.    Yes [provider]  donepezil (ARICEPT) 10 MG tablet Take 10 mg by mouth daily.    Yes [provider]  ferrous sulfate 325 (65 FE) MG EC tablet Take 325 mg by mouth daily with breakfast.    Yes [provider]  furosemide (LASIX) 20 MG tablet Take 20 mg by mouth daily.    Yes [provider]  HYDROcodone-acetaminophen (NORCO/VICODIN) 5-325 MG tablet Take 1-2 tablets by mouth every 6 (six) hours as needed. 08/19/17  Yes Maxwell Caul, PA-C  Hypromellose (ARTIFICIAL TEARS) 0.4 % SOLN Place 1 drop into both eyes 3 (three) times daily.   Yes [provider]  ketoconazole (NIZORAL) 2 % cream Apply 1 application topically See admin instructions. Twice daily on Monday and Friday   Yes [provider]  ketoconazole (NIZORAL) 2 % shampoo Apply 1 application topically 2 (two) times a week. On Monday and Thursday   Yes [provider]  levothyroxine (SYNTHROID, LEVOTHROID) 50 MCG tablet Take 50 mcg by mouth daily before breakfast.   Yes [provider]  lisinopril (PRINIVIL,ZESTRIL) 5 MG tablet Take 5 mg by mouth  daily.   Yes [provider]  loratadine (CLARITIN) 10 MG tablet Take 10 mg by mouth at bedtime.    Yes [provider]  LORazepam (ATIVAN) 0.5 MG tablet Take 0.5 mg by mouth 2 (two) times daily.    Yes [provider]  memantine (NAMENDA) 10 MG tablet Take 10 mg by mouth 2 (two) times daily.   Yes [provider]  mirtazapine (REMERON) 15 MG tablet Take 15 mg by mouth at bedtime.   Yes [provider]  omeprazole (PRILOSEC) 20 MG capsule Take 20 mg by mouth daily.   Yes [provider]  propafenone (RYTHMOL) 150 MG  tablet Take 150 mg by mouth 3 (three) times daily.    Yes [provider]  tamsulosin (FLOMAX) 0.4 MG CAPS capsule Take 0.4 mg by mouth daily.    Yes [provider]  triamcinolone cream (KENALOG) 0.1 % Apply 1 application topically 2 (two) times daily. To legs and arms   Yes [provider]  acetaminophen (TYLENOL) 500 MG tablet Take 2 tablets (1,000 mg total) by mouth every 6 (six) hours as needed for moderate pain. Patient not taking: Reported on 10/29/2017 11/20/16   Long, Arlyss Repress, MD  lidocaine (LIDODERM) 5 % Place 1 patch onto the skin daily. Remove & Discard patch within 12 hours or as directed by MD Patient not taking: Reported on 10/29/2017 11/20/16   Long, Arlyss Repress, MD  Skin Protectants, Misc. (WHITE PETROLATUM-ZINC) 58.3 % cream Apply topically 3 (three) times daily as needed. Patient not taking: Reported on 10/29/2017 08/19/17   Graciella Freer A, PA-C    Scheduled Meds: . atorvastatin  40 mg Oral QHS  . docusate sodium  100 mg Oral QHS  . donepezil  10 mg Oral Daily  . ferrous sulfate  325 mg Oral Q breakfast  . furosemide  20 mg Oral Daily  . hydroxypropyl methylcellulose / hypromellose  1 drop Both Eyes TID  . levothyroxine  50 mcg Oral QAC breakfast  . lisinopril  5 mg Oral Daily  . loratadine  10 mg Oral QHS  . LORazepam  0.5 mg Oral BID  . memantine  10 mg Oral BID  . mirtazapine  15 mg Oral QHS  . pantoprazole  40 mg Oral Daily  . propafenone  150 mg Oral TID  . tamsulosin  0.4 mg Oral Daily  . triamcinolone cream  1 application Topical BID   Continuous Infusions: . sodium chloride 75 mL/hr at 10/30/17 0138   PRN Meds:.acetaminophen **OR** acetaminophen, albuterol, HYDROcodone-acetaminophen, ondansetron **OR** ondansetron (ZOFRAN) IV  Allergies as of 10/29/2017  . (No Known Allergies)   Family History  Problem Relation Age of Onset  . Heart disease Father    Social History   Socioeconomic History  . Marital status: Married    Spouse  name: Not on file  . Number of children: Not on file  . Years of education: Not on file  . Highest education level: Not on file  Occupational History  . Not on file  Social Needs  . Financial resource strain: Not on file  . Food insecurity:    Worry: Not on file    Inability: Not on file  . Transportation needs:    Medical: Not on file    Non-medical: Not on file  Tobacco Use  . Smoking status: Never Smoker  . Smokeless tobacco: Never Used  Substance and Sexual Activity  . Alcohol use: Not Currently  . Drug use:  Not Currently  . Sexual activity: Not Currently  Lifestyle  . Physical activity:    Days per week: Not on file    Minutes per session: Not on file  . Stress: Not on file  Relationships  . Social connections:    Talks on phone: Not on file    Gets together: Not on file    Attends religious service: Not on file    Active member of club or organization: Not on file    Attends meetings of clubs or organizations: Not on file    Relationship status: Not on file  . Intimate partner violence:    Fear of current or ex partner: Not on file    Emotionally abused: Not on file    Physically abused: Not on file    Forced sexual activity: Not on file  Other Topics Concern  . Not on file  Social History Narrative  . Not on file   Review of Systems: All negative except as stated above in HPI.  Physical Exam: Vital signs: Vitals:   10/30/17 0130 10/30/17 0131  BP: (!) 155/77 (!) 155/77  Pulse:  85  Resp:  18  Temp:  98.7 F (37.1 C)  SpO2:  100%   Last BM Date: 10/29/17  General: Well nourished male in no acute distress HENT: Normocephalic, atraumatic, moist mucus membranes Pulm: Good air movement with no wheezing or crackles  CV: RRR, no murmurs, no rubs  Abdomen: Active bowel sounds, soft, non-distended, no tenderness to palpation  Extremities: Pulses palpable in all extremities, no LE edema  Skin: Warm and dry  Neuro: Alert, not oriented, attempts to answer  questions  GI:  Lab Results: Recent Labs    10/29/17 1939 10/30/17 0505  WBC 8.4 9.3  HGB 14.5 13.2  HCT 45.5 41.2  PLT 219 191   BMET Recent Labs    10/29/17 1939 10/30/17 0505  NA 137 141  K 4.3 3.7  CL 102 106  CO2 27 28  GLUCOSE 135* 126*  BUN 20 16  CREATININE 1.51* 1.30*  CALCIUM 9.8 9.5   LFT Recent Labs    10/29/17 1939  PROT 6.4*  ALBUMIN 3.7  AST 27  ALT 29  ALKPHOS 95  BILITOT 0.6   PT/INR Recent Labs    10/29/17 2022  LABPROT 13.0  INR 0.99   Studies/Results: Ct Abdomen Pelvis W Contrast  Result Date: 10/29/2017 CLINICAL DATA:  Rectal bleeding.  Perianal rash. EXAM: CT ABDOMEN AND PELVIS WITH CONTRAST TECHNIQUE: Multidetector CT imaging of the abdomen and pelvis was performed using the standard protocol following bolus administration of intravenous contrast. CONTRAST:  100mL OMNIPAQUE IOHEXOL 300 MG/ML  SOLN COMPARISON:  None. FINDINGS: Despite efforts by the technologist and patient, motion artifact is present on today's exam and could not be eliminated. This reduces exam sensitivity and specificity. Lower chest: Scarring or atelectasis in both lower lobes. Pacer leads noted. Coronary atherosclerosis. Aortic valve calcification. Hepatobiliary: Cholecystectomy. Nonspecific 1.2 by 1.0 cm hypodense lesion in segment IVb of the liver, image 25/3. This hypodensity is just above the gallbladder fossa. No biliary dilatation. Pancreas: Unremarkable Spleen: Unremarkable Adrenals/Urinary Tract: Scarring anteriorly in the left kidney. The adrenal glands appear normal. No urinary tract calculi are identified. Stomach/Bowel: Postoperative findings in the right colon. Otherwise unremarkable. No specific rectal or visualized perianal abnormality. Vascular/Lymphatic: Aortoiliac atherosclerotic vascular disease. No adenopathy. Reproductive: Unremarkable Other: No supplemental non-categorized findings. Musculoskeletal: 8 mm of degenerative anterolisthesis at L4-5 without  pars defects. Lumbar  spondylosis and degenerative disc disease causing multilevel impingement at all levels between L1 and S1. Lower thoracic spondylosis. Right total hip prosthesis. IMPRESSION: 1. No specific rectal or anal abnormality is identified based on imaging. The region is partially obscured by streak artifact from the patient's right hip implant. 2. Nonspecific small hypodense lesion in the left hepatic lobe just above the gallbladder fossa, probably a cyst or volume averaging of part of the gallbladder fossa, but technically nonspecific. 3. Aortic Atherosclerosis (ICD10-I70.0). Coronary atherosclerosis with aortic valve calcification. 4. Scarring in the left kidney. 5. Postoperative findings in the right colon. 6. Lumbar spondylosis and degenerative disc disease causing multilevel impingement. Electronically Signed   By: Gaylyn Rong M.D.   On: 10/29/2017 23:10   Impression/Plan:  Kenneth Hampton is a 84 y.o. male with CAD s/p CABG and advanced dementia who presented to the ED with acute rectal bleeding, that has since resolved.   Lower GI Bleed  - No more bright red blood per rectum  - BUN 16 - Hgb at 13.2 from a baseline of 15 - Discussed with the wife the utility of proceeding to colonoscopy. Given that the bleeding has stopped and the patient's other comorbidies I am not sure a colonoscopy would be of much utility (would not want additional surgery if anything were found). Instead would recommend monitoring and if bleeding recurs contacting IR for acute intervention with angiography.   Will discuss the case further with Dr. Ewing Schlein   LOS: 0 days   Levora Dredge  MD 10/30/2017, 10:46 AM

## 2017-10-30 NOTE — Progress Notes (Signed)
Kenneth BeachJames Hampton 4:50 PM  Subjective: Patient seen and examined and case discussed with the resident as well as his wife and daughter and he has have no further bleeding since being in the hospital and his hospital computer chart was reviewed and we answered all of their questions  Objective: Vital signs stable afebrile no acute distress abdomen is soft nontender labs reviewed and okay CT okay  Assessment: Bright red blood per rectum questionable etiology  Plan: I gave the wife my card and if bright red blood continues and she would like a workup and he is doing okay happy to proceed with a flexible sigmoidoscopy or colonoscopy at some point and she'll call me as above otherwise please call us if we could be of any further assistance with this hospital stay  [facility] CenterMAGOD,Acelyn Basham E  Pager [phone] After 5PM or if no answer call [phone]

## 2017-10-30 NOTE — Progress Notes (Signed)
PROGRESS NOTE    Fern Asmar  ZOX:[id number] DOB: 08/04/1928 DOA: 10/29/2017 PCP: Angelica Chessman, MD   Brief Narrative:  84 year old male with a history of hypertension, CAD status post CABG, hyperlipidemia, hypothyroidism, advanced dementia, was brought to the hospital for evaluation of rectal bleeding.  Apparently patient had a large bowel movement with bright red blood per rectum and he has had about 3 episodes in the last 3 weeks.  Patient is advanced dementia therefore although this is a history per wife and daughter at bedside.  He was admitted for closer monitoring.   Assessment & Plan:   Principal Problem:   Rectal bleeding Active Problems:   Dementia   CAD (coronary artery disease)   HTN (hypertension)   Hypothyroidism   GI bleed   Rectal bleeding, multifactorial - Rectal bleeding could be secondary to diverticulosis, hemorrhoid, AVM or even possible ulcer given history of partial descending colectomy with primary anastomosis.  At this time his hemoglobin is around 13.0, baseline appears to be 15.  Continue gentle IV fluids, I have consulted gastroenterology who will see the patient today.  If necessary we will keep him n.p.o. past midnight for possible procedural intervention.  At this time vital signs are stable. Family thinks his last colonoscopy was over 5 years ago at an outside hospital. -Continue to hold aspirin  History of coronary artery disease status post CABG in 1996 -Aspirin is on hold.  Continue low-dose Lasix, if necessary we will stop this.  Continue lisinopril 5 mg orally daily.  Essential hypertension -Continue lisinopril  Status post pacemaker - On home Rythmol 150 mg 3 times daily  Advanced dementia -Continue donepezil, Namenda and mirtazapine  CKD stage III -Creatinine appears to be stable.  Will closely monitor this and avoid any other nephrotoxic drugs.  Hypothyroidism -Continue Synthroid 50 mcg daily  BPH -Continue  Flomax  Hyperlipidemia -Continue atorvastatin 40 mg at bedtime  DVT prophylaxis: SCDs Code Status: Full code per family Family Communication: Wife and daughter at bedside Disposition Plan: Patient needs another day of inpatient stay for closer monitoring given his recent multiple episodes of bleeding.  Given the aforementioned, the predictability of an adverse outcome is felt to be significant. I expect that the patient will require at least 2 midnights in the hospital to treat this condition.   Consultants:   GI  Procedures:   None  Antimicrobials:   None    Subjective: Patient is laying in the bed this morning he was refusing his medication.  He is pleasantly confused unable to provide any meaningful history.  I had an extensive conversation with the daughter and the wife this morning who states patient has been having some rectal bleeding over the course of last 3 weeks and had 3 large episodes.  Denies any abdominal pain or other complaints.  Thinks his last colonoscopy was over 5 years ago and does not really know the results of that but does have history of embedded polyp requiring partial colectomy.  Review of Systems Otherwise negative except as per HPI, including: Difficult to obtain but patient denies all the complaints.  Objective: Vitals:   10/29/17 2315 10/30/17 0045 10/30/17 0130 10/30/17 0131  BP:   (!) 155/77 (!) 155/77  Pulse: 68   85  Resp: 18 18  18   Temp:    98.7 F (37.1 C)  TempSrc:    Oral  SpO2: 100%   100%  Weight:      Height:  Intake/Output Summary (Last 24 hours) at 10/30/2017 1022 Last data filed at 10/30/2017 0100 Gross per 24 hour  Intake -  Output 1000 ml  Net -1000 ml   Filed Weights   10/29/17 1913  Weight: 88.9 kg (196 lb)    Examination:  General exam: Appears calm and comfortable; AAOx1; elderly Respiratory system: Clear to auscultation. Respiratory effort normal. Cardiovascular system: S1 & S2 heard, RRR. No JVD,  murmurs, rubs, gallops or clicks. No pedal edema. Gastrointestinal system: Abdomen is nondistended, soft and nontender. No organomegaly or masses felt. Normal bowel sounds heard. Central nervous system: Alert and oriented. No focal neurological deficits. Extremities: Symmetric 4 x 5 power. Skin: No rashes, lesions or ulcers Psychiatry: Poor judgment    Data Reviewed:   CBC: Recent Labs  Lab 10/29/17 1939 10/30/17 0505  WBC 8.4 9.3  HGB 14.5 13.2  HCT 45.5 41.2  MCV 98.3 96.9  PLT 219 191   Basic Metabolic Panel: Recent Labs  Lab 10/29/17 1939 10/30/17 0505  NA 137 141  K 4.3 3.7  CL 102 106  CO2 27 28  GLUCOSE 135* 126*  BUN 20 16  CREATININE 1.51* 1.30*  CALCIUM 9.8 9.5   GFR: Estimated Creatinine Clearance: 40.3 mL/min (A) (by C-G formula based on SCr of 1.3 mg/dL (H)). Liver Function Tests: Recent Labs  Lab 10/29/17 1939  AST 27  ALT 29  ALKPHOS 95  BILITOT 0.6  PROT 6.4*  ALBUMIN 3.7   No results for input(s): LIPASE, AMYLASE in the last 168 hours. No results for input(s): AMMONIA in the last 168 hours. Coagulation Profile: Recent Labs  Lab 10/29/17 2022  INR 0.99   Cardiac Enzymes: No results for input(s): CKTOTAL, CKMB, CKMBINDEX, TROPONINI in the last 168 hours. BNP (last 3 results) No results for input(s): PROBNP in the last 8760 hours. HbA1C: No results for input(s): HGBA1C in the last 72 hours. CBG: No results for input(s): GLUCAP in the last 168 hours. Lipid Profile: No results for input(s): CHOL, HDL, LDLCALC, TRIG, CHOLHDL, LDLDIRECT in the last 72 hours. Thyroid Function Tests: No results for input(s): TSH, T4TOTAL, FREET4, T3FREE, THYROIDAB in the last 72 hours. Anemia Panel: No results for input(s): VITAMINB12, FOLATE, FERRITIN, TIBC, IRON, RETICCTPCT in the last 72 hours. Sepsis Labs: No results for input(s): PROCALCITON, LATICACIDVEN in the last 168 hours.  No results found for this or any previous visit (from the past 240  hour(s)).       Radiology Studies: Ct Abdomen Pelvis W Contrast  Result Date: 10/29/2017 CLINICAL DATA:  Rectal bleeding.  Perianal rash. EXAM: CT ABDOMEN AND PELVIS WITH CONTRAST TECHNIQUE: Multidetector CT imaging of the abdomen and pelvis was performed using the standard protocol following bolus administration of intravenous contrast. CONTRAST:  100mL OMNIPAQUE IOHEXOL 300 MG/ML  SOLN COMPARISON:  None. FINDINGS: Despite efforts by the technologist and patient, motion artifact is present on today's exam and could not be eliminated. This reduces exam sensitivity and specificity. Lower chest: Scarring or atelectasis in both lower lobes. Pacer leads noted. Coronary atherosclerosis. Aortic valve calcification. Hepatobiliary: Cholecystectomy. Nonspecific 1.2 by 1.0 cm hypodense lesion in segment IVb of the liver, image 25/3. This hypodensity is just above the gallbladder fossa. No biliary dilatation. Pancreas: Unremarkable Spleen: Unremarkable Adrenals/Urinary Tract: Scarring anteriorly in the left kidney. The adrenal glands appear normal. No urinary tract calculi are identified. Stomach/Bowel: Postoperative findings in the right colon. Otherwise unremarkable. No specific rectal or visualized perianal abnormality. Vascular/Lymphatic: Aortoiliac atherosclerotic vascular disease. No adenopathy.  Reproductive: Unremarkable Other: No supplemental non-categorized findings. Musculoskeletal: 8 mm of degenerative anterolisthesis at L4-5 without pars defects. Lumbar spondylosis and degenerative disc disease causing multilevel impingement at all levels between L1 and S1. Lower thoracic spondylosis. Right total hip prosthesis. IMPRESSION: 1. No specific rectal or anal abnormality is identified based on imaging. The region is partially obscured by streak artifact from the patient's right hip implant. 2. Nonspecific small hypodense lesion in the left hepatic lobe just above the gallbladder fossa, probably a cyst or volume  averaging of part of the gallbladder fossa, but technically nonspecific. 3. Aortic Atherosclerosis (ICD10-I70.0). Coronary atherosclerosis with aortic valve calcification. 4. Scarring in the left kidney. 5. Postoperative findings in the right colon. 6. Lumbar spondylosis and degenerative disc disease causing multilevel impingement. Electronically Signed   By: Gaylyn Rong M.D.   On: 10/29/2017 23:10        Scheduled Meds: . atorvastatin  40 mg Oral QHS  . docusate sodium  100 mg Oral QHS  . donepezil  10 mg Oral Daily  . ferrous sulfate  325 mg Oral Q breakfast  . furosemide  20 mg Oral Daily  . hydroxypropyl methylcellulose / hypromellose  1 drop Both Eyes TID  . levothyroxine  50 mcg Oral QAC breakfast  . lisinopril  5 mg Oral Daily  . loratadine  10 mg Oral QHS  . LORazepam  0.5 mg Oral BID  . memantine  10 mg Oral BID  . mirtazapine  15 mg Oral QHS  . pantoprazole  40 mg Oral Daily  . propafenone  150 mg Oral TID  . tamsulosin  0.4 mg Oral Daily  . triamcinolone cream  1 application Topical BID   Continuous Infusions: . sodium chloride 75 mL/hr at 10/30/17 0138     LOS: 0 days    I have spent 25 minutes face to face with the patient and on the ward discussing the patients care, assessment, plan and disposition with other care givers. >50% of the time was devoted counseling the patient about the risks and benefits of treatment and coordinating care.     Edilberto Roosevelt Joline Maxcy, MD [facility] Pager [phone]   If 7PM-7AM, please contact night-coverage [email or web address] Password [facility] 10/30/2017, 10:22 AM

## 2017-10-30 NOTE — Consult Note (Addendum)
WOC Nurse wound consult note Reason for Consult: Consult requested for bilat buttocks wounds.  Family members at the bedside state these have been present "for a long time" prior to admission, and they will  improve, then decline again.  They report patient is frequently moist to this location and sits in a recliner chair for up to 20 hours a day. Pt has recently been experiencing rectal bleeding and this has contributed to the skin breakdown.  It will be difficult to promote healing and keep the area from becoming soiled if this continues, related to the close proximity to the rectum. Wound type: Several chronic full thickness wounds; appearance is consistent with chronic moisture associated skin damage and shear, NOT pressure Pressure Injury POA: These are not pressure injuries; YES, they were present on admission Measurement: Affected area is approx 4X2cm and involves bilat inner buttocks in the gluteal crease Wound bed: There are approx 4 full thickness wounds, all appprox .5X.2X.2cm in size or smaller, red and moist, small amt tan drainage, no odor. Periwound: generalized erythremia surrounding the location. Dressing procedure/placement/frequency: Foam dressing to protect from further injury.  Barrier cream to repel moisture. Pt is in bed instead of a recliner so this may assist with promoting healing to the affected areas.  Discussed plan of care with family members at the bedside.  Please re-consult if further assistance is needed.  Thank-you,  Cammie Mcgeeawn Evy Lutterman MSN, RN, CWOCN, AlexisWCN-AP, CNS [phone]

## 2017-10-30 NOTE — Progress Notes (Signed)
Pt admitted to the unit with family. Pt is stable, alert but confused per baseline. Family oriented to room, staff, and call bell. Educated to call for any assistance. Bed in lowest position, call bell within reach- will continue to monitor.

## 2017-10-30 NOTE — Progress Notes (Signed)
Pt refused orthostatics- tried to Product/process development scientistfight staff.

## 2017-10-31 DIAGNOSIS — K625 Hemorrhage of anus and rectum: Secondary | ICD-10-CM

## 2017-10-31 DIAGNOSIS — K922 Gastrointestinal hemorrhage, unspecified: Principal | ICD-10-CM

## 2017-10-31 DIAGNOSIS — E039 Hypothyroidism, unspecified: Secondary | ICD-10-CM

## 2017-10-31 DIAGNOSIS — F0391 Unspecified dementia with behavioral disturbance: Secondary | ICD-10-CM

## 2017-10-31 DIAGNOSIS — I25810 Atherosclerosis of coronary artery bypass graft(s) without angina pectoris: Secondary | ICD-10-CM

## 2017-10-31 MED ORDER — TRIAMCINOLONE ACETONIDE 0.1 % EX CREA: 1.0000 "application " | TOPICAL_CREAM | Freq: Two times a day (BID) | CUTANEOUS | 0 refills | Status: AC

## 2017-10-31 MED ORDER — KETOCONAZOLE 2 % EX CREA: 1.0000 "application " | TOPICAL_CREAM | CUTANEOUS | 0 refills | Status: AC

## 2017-10-31 MED ORDER — KETOCONAZOLE 2 % EX SHAM: 1.0000 "application " | MEDICATED_SHAMPOO | CUTANEOUS | 0 refills | Status: AC

## 2017-10-31 NOTE — Progress Notes (Signed)
PROGRESS NOTE  Kenneth BeachJames Hampton  NWG:[id number] DOB: 1928/06/15 DOA: 10/29/2017 PCP: Angelica ChessmanAguiar, Rafaela M, MD   Brief Narrative:  Kenneth Hampton is a 84 y.o. male with a past medical history significant for hypertension, hyperlipidemia, CAD s/p CABG in 1996, dementia, hypothyroidism, and colectomy (18 inches of of descending colon removed due to embedded polyp found in 2008 w/primary anastomosis). Patient has significant dementia - history obtained primarily from daughter and wife. He presented to the emergency room with 1 day history of rectal bleeding. He had had 3 rectal bleeding episodes in the past 3 weeks. He does have a history of pressure ulcers near his rectum, which the wife had been treating with topicals and originally thought was the source of the bleeding. Patient has no history of rectal bleeding. At baseline, the patient is mostly sedentary and ambulates with walker. In the emergency room, his vitals were BP 118/57, HR 62, Temp 97.9, RR 18 and SpO2 96% on room air. He was found to have tenderness in the LLQ. GU exam showed skin breakdown near the anus and dark dried blood noted adjacent to the anus. External hemorrhoids were present. Pertinent lab findings: Hb 14.5, BUN 20, sCr 1.51 and INR 0.99. CT of abdomen/pelvis showed no acute process or source of bleeding. Patient was admitted for observation with the working diagnosis of rectal bleeding, unknown etiology.   Subjective: The patient is not doing well this morning. He did not sleep last night and had constant visual hallucinations. Patient's daughter is at bedside, tearful, and reports that she has never seen her father in this state - particularly so combative and hallucinating. She reports that there have been no additional episodes of rectal bleeding and his bowel movements have returned to baseline. Patient is complaining of pain, but cannot elaborate further. ROS limited by dementia.  Assessment & Plan: Rectal Bleeding, unknown etiology    - Rectal bleeding could be secondary to diverticulosis, hemorrhoids or ulcer. Appears stable, with no additional episodes since admission. Stool guaiac 7/16 positive. - Hb today is 13.2, baseline ~15. Appears stable, continue to monitor with daily CBC. - Continue to old aspirin. Continue IVF - GI has consulted and is following with the patient. They have discussed with the family and have recommended holding off on colonoscopy and continuing to monitor. If bleeding recurs, they will contact IR. Otherwise, recommend outpatient GI follow-up.  History of CAD, s/p CABG in 1996 - Continue to hold aspirin. Continue Lasix 20 mg and lisinopril 5mg  daily.   Hypertension  - Continue lisinopril 5 mg daily and lasix 20 mg daily.  - BP is elevated to 180/72 today, likely related to patient's agitation. Continue to monitor BP closely.   Pacemaker  - Continue Rythmol 150 mg TID.  Advanced Dementia - Continue aricept, namenda, and mirtazapine. Patient has increased agitation/combativeness. Haldol used, with minimal relief.  - PT evaluation placed as per family, he has not done any activity since admission.  - Follow-up with neurologist outpatient.   CKD Stage III - sCr has stabilized at 1.30. Continue to monitor with daily bmp. Avoid NSAIDs and nephrotoxic agents.   Hypothyroidism - Continue synthroid 50 mcg daily.  BPH - Continue Flomax 0.4 mg daily   Hyperlipidemia - Continue Lipitor 40mg  at bedtime.  DVT prophylaxis: SCDs. Code Status: FULL Family Communication: Daughter at bedside  Disposition Plan: Discharge to assisted living facility likely today or tomorrow, pending PT evaluation and continued stability of Hb.  Consultants:   GI  Wound Care  Physical Therapy   Procedures:   None  Antimicrobials:   None   Objective: Vitals:   10/30/17 0131 10/30/17 1525 10/30/17 2137 10/31/17 0628  BP: (!) 155/77 (!) 159/60 (!) 162/85 (!) 180/72  Pulse: 85 60 90 81  Resp: 18 18 18  18   Temp: 98.7 F (37.1 C) (!) 97.5 F (36.4 C) 97.8 F (36.6 C) 98.2 F (36.8 C)  TempSrc: Oral Axillary Axillary Axillary  SpO2: 100% 97% 94% (!) 89%  Weight:      Height:        Intake/Output Summary (Last 24 hours) at 10/31/2017 1005 Last data filed at 10/31/2017 0630 Gross per 24 hour  Intake 2589.82 ml  Output 2150 ml  Net 439.82 ml   Filed Weights   10/29/17 1913  Weight: 88.9 kg (196 lb)   Examination: General appearance: elederly adult male, alert and awake. NAD.   HEENT: Anicteric, conjunctiva pink, lids and lashes normal. No nasal deformity, discharge, epistaxis. Skin: Warm and dry.  No suspicious rashes or lesions. Cardiac: RRR, nl S1-S2, no murmurs appreciated. No JVD. No LE edema. Respiratory: Normal respiratory rate and rhythm.  CTAB without wheezes, crackles or rales. Abdomen: Abdomen soft.  No TTP. No ascites or distension.   MSK: No deformities or effusions. Neuro: Awake and alert. AOx1. Patient is having obvious visual hallucinations. Appears restless and agitated, pulling at his sheets. Unable to answer any further questions.  Data Reviewed: I have personally reviewed following labs and imaging studies:  CBC: Recent Labs  Lab 10/29/17 1939 10/30/17 0505  WBC 8.4 9.3  HGB 14.5 13.2  HCT 45.5 41.2  MCV 98.3 96.9  PLT 219 191   Basic Metabolic Panel: Recent Labs  Lab 10/29/17 1939 10/30/17 0505  NA 137 141  K 4.3 3.7  CL 102 106  CO2 27 28  GLUCOSE 135* 126*  BUN 20 16  CREATININE 1.51* 1.30*  CALCIUM 9.8 9.5   GFR: Estimated Creatinine Clearance: 40.3 mL/min (A) (by C-G formula based on SCr of 1.3 mg/dL (H)).  Liver Function Tests: Recent Labs  Lab 10/29/17 1939  AST 27  ALT 29  ALKPHOS 95  BILITOT 0.6  PROT 6.4*  ALBUMIN 3.7   Coagulation Profile: Recent Labs  Lab 10/29/17 2022  INR 0.99   Urine analysis:    Component Value Date/Time   COLORURINE YELLOW 11/20/2016 0556   APPEARANCEUR CLEAR 11/20/2016 0556   LABSPEC  1.006 11/20/2016 0556   PHURINE 7.5 11/20/2016 0556   GLUCOSEU NEGATIVE 11/20/2016 0556   HGBUR NEGATIVE 11/20/2016 0556   BILIRUBINUR NEGATIVE 11/20/2016 0556   KETONESUR NEGATIVE 11/20/2016 0556   PROTEINUR NEGATIVE 11/20/2016 0556   NITRITE NEGATIVE 11/20/2016 0556   LEUKOCYTESUR NEGATIVE 11/20/2016 0556   Radiology Studies: Ct Abdomen Pelvis W Contrast  Result Date: 10/29/2017 CLINICAL DATA:  Rectal bleeding.  Perianal rash. EXAM: CT ABDOMEN AND PELVIS WITH CONTRAST TECHNIQUE: Multidetector CT imaging of the abdomen and pelvis was performed using the standard protocol following bolus administration of intravenous contrast. CONTRAST:  OMNIPAQUE IOHEXOL 300 MG/ML  SOLN COMPARISON:  None. FINDINGS: Despite efforts by the technologist and patient, motion artifact is present on today's exam and could not be eliminated. This reduces exam sensitivity and specificity. Lower chest: Scarring or atelectasis in both lower lobes. Pacer leads noted. Coronary atherosclerosis. Aortic valve calcification. Hepatobiliary: Cholecystectomy. Nonspecific 1.2 by 1.0 cm hypodense lesion in segment IVb of the liver, image 25/3. This hypodensity is just above the gallbladder fossa. No biliary  dilatation. Pancreas: Unremarkable Spleen: Unremarkable Adrenals/Urinary Tract: Scarring anteriorly in the left kidney. The adrenal glands appear normal. No urinary tract calculi are identified. Stomach/Bowel: Postoperative findings in the right colon. Otherwise unremarkable. No specific rectal or visualized perianal abnormality. Vascular/Lymphatic: Aortoiliac atherosclerotic vascular disease. No adenopathy. Reproductive: Unremarkable Other: No supplemental non-categorized findings. Musculoskeletal: 8 mm of degenerative anterolisthesis at L4-5 without pars defects. Lumbar spondylosis and degenerative disc disease causing multilevel impingement at all levels between L1 and S1. Lower thoracic spondylosis. Right total hip prosthesis.  IMPRESSION: 1. No specific rectal or anal abnormality is identified based on imaging. The region is partially obscured by streak artifact from the patient's right hip implant. 2. Nonspecific small hypodense lesion in the left hepatic lobe just above the gallbladder fossa, probably a cyst or volume averaging of part of the gallbladder fossa, but technically nonspecific. 3. Aortic Atherosclerosis (ICD10-I70.0). Coronary atherosclerosis with aortic valve calcification. 4. Scarring in the left kidney. 5. Postoperative findings in the right colon. 6. Lumbar spondylosis and degenerative disc disease causing multilevel impingement. Electronically Signed   By: Gaylyn Rong M.D.   On: 10/29/2017 23:10   Scheduled Meds: . atorvastatin  40 mg Oral QHS  . docusate sodium  100 mg Oral QHS  . donepezil  10 mg Oral Daily  . ferrous sulfate  325 mg Oral Q breakfast  . furosemide  20 mg Oral Daily  . hydroxypropyl methylcellulose / hypromellose  1 drop Both Eyes TID  . levothyroxine  50 mcg Oral QAC breakfast  . lisinopril  5 mg Oral Daily  . loratadine  10 mg Oral QHS  . LORazepam  0.5 mg Oral BID  . memantine  10 mg Oral BID  . mirtazapine  15 mg Oral QHS  . pantoprazole  40 mg Oral Daily  . propafenone  150 mg Oral TID  . tamsulosin  0.4 mg Oral Daily  . triamcinolone cream  1 application Topical BID   Continuous Infusions: . sodium chloride 75 mL/hr at 10/31/17 0355    LOS: 1 day   Time spent: 25 minutes  Kenneth Eugene, PA-S [facility] 10/31/2017, 10:05 AM   [email or web address] Password [email or web address] If 7PM-7AM, please contact night-coverage

## 2017-10-31 NOTE — Progress Notes (Addendum)
[facility] Progress Note  Liz BeachJames Blickenstaff 84 y.o. Jun 25, 1928  CC:  Bright red blood per rectum   Subjective: Patient not doing well this AM. Daughter at bedside states the he is becoming combative and hallucinating. He did not get much sleep last night. She states that his outpatient neurologist mentioned starting a new medication because he has episodes like this at [facility] but she cannot recall name.   He has had not more bleeding or blood on his sheets. Nursing reports the same.   ROS : Unable to obtain due to patient's AMS  Objective: Vital signs in last 24 hours: Vitals:   10/30/17 2137 10/31/17 0628  BP: (!) 162/85 (!) 180/72  Pulse: 90 81  Resp: 18 18  Temp: 97.8 F (36.6 C) 98.2 F (36.8 C)  SpO2: 94% (!) 89%   General: Elderly male in no acute distress CV: RRR, no murmurs, no rubs  Abdomen: Active bowel sounds, soft, non-distended, no tenderness to palpation  Neuro: Alert, visual hallucinations, agitated  Lab Results: Recent Labs    10/29/17 1939 10/30/17 0505  NA 137 141  K 4.3 3.7  CL 102 106  CO2 27 28  GLUCOSE 135* 126*  BUN 20 16  CREATININE 1.51* 1.30*  CALCIUM 9.8 9.5   Recent Labs    10/29/17 1939  AST 27  ALT 29  ALKPHOS 95  BILITOT 0.6  PROT 6.4*  ALBUMIN 3.7   Recent Labs    10/29/17 1939 10/30/17 0505  WBC 8.4 9.3  HGB 14.5 13.2  HCT 45.5 41.2  MCV 98.3 96.9  PLT 219 191   Recent Labs    10/29/17 2022  LABPROT 13.0  INR 0.99   Assessment/Plan:  Liz BeachJames Sedlack is a 84 y.o. male with CAD s/p CABG and advanced dementia who presented to the ED with acute rectal bleeding, that has since resolved.   Lower GI Bleed  - No more bright red blood per rectum  - Hgb stable  - Unable to do rectal exam and evaluate sacral ulcers as patient will not cooperate.  - Outpatient GI follow-up if needed or wanted by wife  Will discuss the case further with Dr. Ewing SchleinMagod which was done and please call us if we could be of any further  assistance with this hospital stay  Levora DredgeJustin Helberg MD 10/31/2017, 10:18 AM

## 2017-10-31 NOTE — Social Work (Addendum)
CSW aware pt and pt wife live at [facility]. Following for disposition.  2:07pm- CSW aware that pt is discharged, spoke with [place] [facility] [place]. Home Health orders to be faxed to her at [phone].   3:21pm- Therapies do not feel pt is able to discharge safely back to ALF, pt was admitted to inpatient status yesterday. If pt warrants two more days of medically necessity then we will arrange for SNF at point of discharge.   Doy HutchingIsabel H Benjaman Artman, LCSWA [facility] Clinical Social Work [phone]

## 2017-10-31 NOTE — Discharge Summary (Addendum)
Physician Discharge Summary  Kenneth Hampton ZOX:[id number] DOB: 02-Aug-1928 DOA: 10/29/2017  PCP: Angelica Chessman, MD  Admit date: 10/29/2017 Discharge date: 11/02/2017  Admitted From: Home (assisted living) Disposition: SNF  Recommendations for Outpatient Follow-up and new medication changes:  1. Follow up with Dr. Riley Nearing in 7 days 2. Resume topical ketoconazole and triamcinolone  3. Aspirin has been discontinued. 4. Furosemide to use only as need for 3 lbs weight gain in 24 hours or 5 lbs weight gain in 7 days.   Home Health: NA Equipment/Devices: wheelchair    Discharge Condition: stable  CODE STATUS: full  Diet recommendation: Heart healthy   Brief/Interim Summary: 84 year old male who presented with rectal bleeding.  He does have the significant past medical history for hypertension, dyslipidemia, coronary disease status post CABG, dementia, and hypothyroidism.  Patient is mainly sedentary, uses a walker and wheelchair for mobility.  All information obtained from his wife at bedside due to his dementia. He was noted to have persistent bright red blood per rectum.  On his initial physical examination blood pressure 136/64, pulse rate 56, respiratory rate 16, oxygen saturation 93%, moist mucous membranes, lungs clear to auscultation bilaterally, heart S1-S2 present, bradycardic, no gallops, rubs or murmurs, the abdomen was soft nontender, no lower extremity edema.  Sodium 137, potassium 4.3, chloride 102, bicarb 27, glucose 135, BUN 20, creatinine 1.51, white count 8.4, hemoglobin 14.5, hematocrit 45.5, platelets 219.  CT of the abdomen pelvis with no acute changes.  EEG atrial and ventricular pacing.  Pacer related to left axis deviation.  Patient was admitted to the hospital with a working diagnosis of rectal bleeding.   1.  Rectal bleeding, multifactorial.  Patient was admitted to the medical ward, he was placed on a remote telemetry monitor, received IV fluids, hemoglobin and hematocrit  remained stable, patient was seen by gastroenterology, with recommendations of conservative management, no endoscopy procedures were performed.  Discharge hemoglobin 13.2, hematocrit 41.2.  2.  Coronary disease status post CABG. (1996).  Patient remains chest pain-free, continue lisinopril.  Aspirin was held during his hospitalization.  No significant bleed.   3.  Pre-renal AKI on chronic kidney disease stage III.  Patient had poor oral intake, provoking prerenal acute kidney injury.  Patient was placed on isotonic fluids with improvement of kidney function, discharge creatinine 1.17, with potassium of 3.9 and serum bicarb 25. Will change furosemide to use only as needed.   4.  Post pacemaker.  His device was checked July 2019,100%  A and V pacing, with no underlying heart rhythm. During hospitalization patient remained on a paced rhythm. Will hold on aspirin for now. Continue propafenone.   5.  Alzheimer's dementia.  Advanced disease, continue donepezil, lorazepam, namenda and mirtazapine.  Patient did develop worsening confusion and agitation while hospitalized, symptoms more severe at night.  Patient was seen by physical therapy for before discharge, with recommendations for skilled nursing facility. Discontinue narcotics.   6.  Hypothyroidism.  Continue levothyroxine.  7. Eczema. Continue as needed topical steroids.   8. HTN. Will resume lisinopril at discharge for blood pressure control.  Discharge Diagnoses:  Principal Problem:   Rectal bleeding Active Problems:   Dementia   CAD (coronary artery disease)   HTN (hypertension)   Hypothyroidism   GI bleed    Discharge Instructions   Allergies as of 11/02/2017   No Known Allergies     Medication List    STOP taking these medications   acetaminophen 500 MG tablet Commonly known as:  TYLENOL   aspirin EC 81 MG tablet   HYDROcodone-acetaminophen 5-325 MG tablet Commonly known as:  NORCO/VICODIN   lidocaine 5 % Commonly  known as:  LIDODERM   white petrolatum-zinc 58.3 % cream     TAKE these medications   ARTIFICIAL TEARS 0.4 % Soln Generic drug:  Hypromellose Place 1 drop into both eyes 3 (three) times daily.   atorvastatin 40 MG tablet Commonly known as:  LIPITOR Take 40 mg by mouth at bedtime.   Cholecalciferol 2000 units Tabs Take 2,000 Units by mouth daily.   docusate sodium 100 MG capsule Commonly known as:  COLACE Take 100 mg by mouth at bedtime.   donepezil 10 MG tablet Commonly known as:  ARICEPT Take 10 mg by mouth daily.   ferrous sulfate 325 (65 FE) MG EC tablet Take 325 mg by mouth daily with breakfast.   furosemide 20 MG tablet Commonly known as:  LASIX Take 1 tablet (20 mg total) by mouth daily as needed for fluid or edema (weight gain 3 lbs in 24 hours or 5 lbs in 7 days.). What changed:    when to take this  reasons to take this   ketoconazole 2 % cream Commonly known as:  NIZORAL Apply 1 application topically See admin instructions. Twice daily on Monday and Friday   ketoconazole 2 % shampoo Commonly known as:  NIZORAL Apply 1 application topically 2 (two) times a week. On Monday and Thursday   levothyroxine 50 MCG tablet Commonly known as:  SYNTHROID, LEVOTHROID Take 50 mcg by mouth daily before breakfast.   lisinopril 5 MG tablet Commonly known as:  PRINIVIL,ZESTRIL Take 5 mg by mouth daily.   loratadine 10 MG tablet Commonly known as:  CLARITIN Take 10 mg by mouth at bedtime.   LORazepam 0.5 MG tablet Commonly known as:  ATIVAN Take 1 tablet (0.5 mg total) by mouth 2 (two) times daily.   memantine 10 MG tablet Commonly known as:  NAMENDA Take 10 mg by mouth 2 (two) times daily.   mirtazapine 15 MG tablet Commonly known as:  REMERON Take 15 mg by mouth at bedtime.   omeprazole 20 MG capsule Commonly known as:  PRILOSEC Take 20 mg by mouth daily.   propafenone 150 MG tablet Commonly known as:  RYTHMOL Take 150 mg by mouth 3 (three) times  daily.   tamsulosin 0.4 MG Caps capsule Commonly known as:  FLOMAX Take 0.4 mg by mouth daily.   triamcinolone cream 0.1 % Commonly known as:  KENALOG Apply 1 application topically 2 (two) times daily. To legs and arms   vitamin C 100 MG tablet Take 100 mg by mouth at bedtime.       No Known Allergies  Consultations:  GI    Procedures/Studies: Ct Abdomen Pelvis W Contrast  Result Date: 10/29/2017 CLINICAL DATA:  Rectal bleeding.  Perianal rash. EXAM: CT ABDOMEN AND PELVIS WITH CONTRAST TECHNIQUE: Multidetector CT imaging of the abdomen and pelvis was performed using the standard protocol following bolus administration of intravenous contrast. CONTRAST:  100mL OMNIPAQUE IOHEXOL 300 MG/ML  SOLN COMPARISON:  None. FINDINGS: Despite efforts by the technologist and patient, motion artifact is present on today's exam and could not be eliminated. This reduces exam sensitivity and specificity. Lower chest: Scarring or atelectasis in both lower lobes. Pacer leads noted. Coronary atherosclerosis. Aortic valve calcification. Hepatobiliary: Cholecystectomy. Nonspecific 1.2 by 1.0 cm hypodense lesion in segment IVb of the liver, image 25/3. This hypodensity is just above the gallbladder  fossa. No biliary dilatation. Pancreas: Unremarkable Spleen: Unremarkable Adrenals/Urinary Tract: Scarring anteriorly in the left kidney. The adrenal glands appear normal. No urinary tract calculi are identified. Stomach/Bowel: Postoperative findings in the right colon. Otherwise unremarkable. No specific rectal or visualized perianal abnormality. Vascular/Lymphatic: Aortoiliac atherosclerotic vascular disease. No adenopathy. Reproductive: Unremarkable Other: No supplemental non-categorized findings. Musculoskeletal: 8 mm of degenerative anterolisthesis at L4-5 without pars defects. Lumbar spondylosis and degenerative disc disease causing multilevel impingement at all levels between L1 and S1. Lower thoracic spondylosis.  Right total hip prosthesis. IMPRESSION: 1. No specific rectal or anal abnormality is identified based on imaging. The region is partially obscured by streak artifact from the patient's right hip implant. 2. Nonspecific small hypodense lesion in the left hepatic lobe just above the gallbladder fossa, probably a cyst or volume averaging of part of the gallbladder fossa, but technically nonspecific. 3. Aortic Atherosclerosis (ICD10-I70.0). Coronary atherosclerosis with aortic valve calcification. 4. Scarring in the left kidney. 5. Postoperative findings in the right colon. 6. Lumbar spondylosis and degenerative disc disease causing multilevel impingement. Electronically Signed   By: Gaylyn Rong M.D.   On: 10/29/2017 23:10       Subjective: Patient has been confused and disorientated, agitated at night. This am with no nausea or vomiting, no significant bleeding.    Discharge Exam: Vitals:   10/30/17 2137 10/31/17 0628  BP: (!) 162/85 (!) 180/72  Pulse: 90 81  Resp: 18 18  Temp: 97.8 F (36.6 C) 98.2 F (36.8 C)  SpO2: 94% (!) 89%   Vitals:   10/30/17 0131 10/30/17 1525 10/30/17 2137 10/31/17 0628  BP: (!) 155/77 (!) 159/60 (!) 162/85 (!) 180/72  Pulse: 85 60 90 81  Resp: 18 18 18 18   Temp: 98.7 F (37.1 C) (!) 97.5 F (36.4 C) 97.8 F (36.6 C) 98.2 F (36.8 C)  TempSrc: Oral Axillary Axillary Axillary  SpO2: 100% 97% 94% (!) 89%  Weight:      Height:        General: Not in pain or dyspnea, deconditioned  Neurology: Awake, confused and disorientated.  E ENT: no pallor, no icterus, oral mucosa moist Cardiovascular: No JVD. S1-S2 present, rhythmic, no gallops, rubs, or murmurs. No lower extremity edema. Pulmonary: vesicular breath sounds bilaterally, adequate air movement, no wheezing, rhonchi or rales. Gastrointestinal. Abdomen with no organomegaly, non tender, no rebound or guarding Skin. No rashes/ limited exam Musculoskeletal: no joint deformities   The results of  significant diagnostics from this hospitalization (including imaging, microbiology, ancillary and laboratory) are listed below for reference.     Microbiology: No results found for this or any previous visit (from the past 240 hour(s)).   Labs: BNP (last 3 results) No results for input(s): BNP in the last 8760 hours. Basic Metabolic Panel: Recent Labs  Lab 10/29/17 1939 10/30/17 0505  NA 137 141  K 4.3 3.7  CL 102 106  CO2 27 28  GLUCOSE 135* 126*  BUN 20 16  CREATININE 1.51* 1.30*  CALCIUM 9.8 9.5   Liver Function Tests: Recent Labs  Lab 10/29/17 1939  AST 27  ALT 29  ALKPHOS 95  BILITOT 0.6  PROT 6.4*  ALBUMIN 3.7   No results for input(s): LIPASE, AMYLASE in the last 168 hours. No results for input(s): AMMONIA in the last 168 hours. CBC: Recent Labs  Lab 10/29/17 1939 10/30/17 0505  WBC 8.4 9.3  HGB 14.5 13.2  HCT 45.5 41.2  MCV 98.3 96.9  PLT 219 191  Cardiac Enzymes: No results for input(s): CKTOTAL, CKMB, CKMBINDEX, TROPONINI in the last 168 hours. BNP: Invalid input(s): POCBNP CBG: No results for input(s): GLUCAP in the last 168 hours. D-Dimer No results for input(s): DDIMER in the last 72 hours. Hgb A1c No results for input(s): HGBA1C in the last 72 hours. Lipid Profile No results for input(s): CHOL, HDL, LDLCALC, TRIG, CHOLHDL, LDLDIRECT in the last 72 hours. Thyroid function studies No results for input(s): TSH, T4TOTAL, T3FREE, THYROIDAB in the last 72 hours.  Invalid input(s): FREET3 Anemia work up No results for input(s): VITAMINB12, FOLATE, FERRITIN, TIBC, IRON, RETICCTPCT in the last 72 hours. Urinalysis    Component Value Date/Time   COLORURINE YELLOW 11/20/2016 0556   APPEARANCEUR CLEAR 11/20/2016 0556   LABSPEC 1.006 11/20/2016 0556   PHURINE 7.5 11/20/2016 0556   GLUCOSEU NEGATIVE 11/20/2016 0556   HGBUR NEGATIVE 11/20/2016 0556   BILIRUBINUR NEGATIVE 11/20/2016 0556   KETONESUR NEGATIVE 11/20/2016 0556   PROTEINUR  NEGATIVE 11/20/2016 0556   NITRITE NEGATIVE 11/20/2016 0556   LEUKOCYTESUR NEGATIVE 11/20/2016 0556   Sepsis Labs Invalid input(s): PROCALCITONIN,  WBC,  LACTICIDVEN Microbiology No results found for this or any previous visit (from the past 240 hour(s)).   Time coordinating discharge: 45 minutes  SIGNED:   Coralie Keens, MD  [facility] 10/31/2017, 1:05 PM Pager [phone]  If 7PM-7AM, please contact night-coverage [email or web address] Password [email or web address]

## 2017-10-31 NOTE — Plan of Care (Signed)
  Problem: Clinical Measurements: Goal: Cardiovascular complication will be avoided Outcome: Progressing   Problem: Nutrition: Goal: Adequate nutrition will be maintained Outcome: Progressing   Problem: Elimination: Goal: Will not experience complications related to urinary retention Outcome: Progressing   Problem: Pain Managment: Goal: General experience of comfort will improve Outcome: Progressing   

## 2017-10-31 NOTE — Care Management Note (Signed)
Case Management Note  Patient Details  Name: Kenneth Hampton MRN: [id number] Date of Birth: June 03, 1928  Subjective/Objective:                    Action/Plan:  Home health and face to face orders faxed to Juanita at [facility] at [phone] Expected Discharge Date:  10/31/17               Expected Discharge Plan:  Assisted Living / Rest Home  In-House Referral:     Discharge planning Services     Post Acute Care Choice:    Choice offered to:     DME Arranged:    DME Agency:     HH Arranged:  PT, RN HH Agency:  NA  Status of Service:  Completed, signed off  If discussed at Long Length of Stay Meetings, dates discussed:    Additional Comments:  Kingsley PlanWile, Kenneth Mccahill Marie, RN 10/31/2017, 3:12 PM

## 2017-10-31 NOTE — NC FL2 (Addendum)
[place] MEDICAID FL2 LEVEL OF CARE SCREENING TOOL     IDENTIFICATION  Patient Name: Kenneth Hampton Birthdate: 1929/01/17 Sex: male Admission Date (Current Location): 10/29/2017  [facility] and [place] Number:  Producer, television/film/video and Address:  [facility], [address] [place], [place] [address]      Provider Number: [id number]  Attending Physician Name and Address:  Coralie Keens,*  Relative Name and Phone Number:   Jasir Rother;    [phone]  Current Level of Care: Hospital Recommended Level of Care: Skilled Nursing Facility Prior Approval Number:    Date Approved/Denied:   PASRR Number:   [id number]   Discharge Plan: Skilled Nursing Facility   Current Diagnoses: Patient Active Problem List   Diagnosis Date Noted  . Rectal bleeding 10/30/2017  . Dementia 10/30/2017  . CAD (coronary artery disease) 10/30/2017  . HTN (hypertension) 10/30/2017  . Hypothyroidism 10/30/2017  . GI bleed 10/30/2017    Orientation RESPIRATION BLADDER Height & Weight     Self(fluctuating orientation due to memory impairment)  Normal Incontinent, External catheter Weight: 196 lb (88.9 kg) Height:  5\' 4"  (162.6 cm)  BEHAVIORAL SYMPTOMS/MOOD NEUROLOGICAL BOWEL NUTRITION STATUS    (hx of dementia) Incontinent Diet(regular diet)  AMBULATORY STATUS COMMUNICATION OF NEEDS Skin   Extensive Assist Verbally Other (Comment)(wounds on buttocks with foam dressing)                       Personal Care Assistance Level of Assistance  Bathing, Feeding, Dressing Bathing Assistance: Maximum assistance Feeding assistance: Independent Dressing Assistance: Maximum assistance     Functional Limitations Info  Sight, Hearing, Speech Sight Info: Adequate Hearing Info: Impaired Speech Info: Impaired    SPECIAL CARE FACTORS FREQUENCY  PT (By licensed PT), OT (By licensed OT)     PT Frequency: 5x week OT Frequency: 5x week            Contractures  Contractures Info: Not present    Additional Factors Info  Code Status, Allergies, Psychotropic Code Status Info: Full Code Allergies Info: No Known Allergies Psychotropic Info: donepezil (ARICEPT) tablet 10 mg daily PO; mirtazapine (REMERON) tablet 15 mg daily at bedtime PO; memantine (NAMENDA) tablet 10 mg 2x daily PO         Current Medications (10/31/2017):  This is the current hospital active medication list Current Facility-Administered Medications  Medication Dose Route Frequency Provider Last Rate Last Dose  . 0.9 %  sodium chloride infusion   Intravenous Continuous Madelyn Flavors A, MD 75 mL/hr at 10/31/17 0355    . acetaminophen (TYLENOL) tablet 650 mg  650 mg Oral Q6H PRN Madelyn Flavors A, MD   650 mg at 10/30/17 1459   Or  . acetaminophen (TYLENOL) suppository 650 mg  650 mg Rectal Q6H PRN Smith, Rondell A, MD      . albuterol (PROVENTIL) (2.5 MG/3ML) 0.083% nebulizer solution 2.5 mg  2.5 mg Nebulization Q6H PRN Smith, Rondell A, MD      . atorvastatin (LIPITOR) tablet 40 mg  40 mg Oral QHS Smith, Rondell A, MD   40 mg at 10/30/17 2116  . docusate sodium (COLACE) capsule 100 mg  100 mg Oral QHS Smith, Rondell A, MD   100 mg at 10/30/17 2116  . donepezil (ARICEPT) tablet 10 mg  10 mg Oral Daily Katrinka Blazing, Rondell A, MD   10 mg at 10/31/17 1055  . ferrous sulfate tablet 325 mg  325 mg Oral Q  breakfast Madelyn FlavorsSmith, Rondell A, MD   325 mg at 10/31/17 1059  . furosemide (LASIX) tablet 20 mg  20 mg Oral Daily Smith, Rondell A, MD   20 mg at 10/31/17 1055  . HYDROcodone-acetaminophen (NORCO/VICODIN) 5-325 MG per tablet 1-2 tablet  1-2 tablet Oral Q6H PRN Madelyn FlavorsSmith, Rondell A, MD   1 tablet at 10/30/17 0137  . hydroxypropyl methylcellulose / hypromellose (ISOPTO TEARS / GONIOVISC) 2.5 % ophthalmic solution 1 drop  1 drop Both Eyes TID Madelyn FlavorsSmith, Rondell A, MD   1 drop at 10/31/17 1056  . levothyroxine (SYNTHROID, LEVOTHROID) tablet 50 mcg  50 mcg Oral QAC breakfast Madelyn FlavorsSmith, Rondell A, MD   50 mcg at  10/31/17 1055  . lisinopril (PRINIVIL,ZESTRIL) tablet 5 mg  5 mg Oral Daily Smith, Rondell A, MD   5 mg at 10/31/17 1056  . loratadine (CLARITIN) tablet 10 mg  10 mg Oral QHS Madelyn FlavorsSmith, Rondell A, MD   10 mg at 10/30/17 2117  . LORazepam (ATIVAN) tablet 0.5 mg  0.5 mg Oral BID Katrinka BlazingSmith, Rondell A, MD   0.5 mg at 10/31/17 1055  . memantine (NAMENDA) tablet 10 mg  10 mg Oral BID Madelyn FlavorsSmith, Rondell A, MD   10 mg at 10/31/17 1055  . mirtazapine (REMERON) tablet 15 mg  15 mg Oral QHS Madelyn FlavorsSmith, Rondell A, MD   15 mg at 10/30/17 2114  . ondansetron (ZOFRAN) tablet 4 mg  4 mg Oral Q6H PRN Madelyn FlavorsSmith, Rondell A, MD       Or  . ondansetron (ZOFRAN) injection 4 mg  4 mg Intravenous Q6H PRN Smith, Rondell A, MD      . pantoprazole (PROTONIX) EC tablet 40 mg  40 mg Oral Daily Smith, Rondell A, MD   40 mg at 10/31/17 1055  . propafenone (RYTHMOL) tablet 150 mg  150 mg Oral TID Madelyn FlavorsSmith, Rondell A, MD   150 mg at 10/31/17 1058  . tamsulosin (FLOMAX) capsule 0.4 mg  0.4 mg Oral Daily Smith, Rondell A, MD   0.4 mg at 10/31/17 1056  . triamcinolone cream (KENALOG) 0.1 % 1 application  1 application Topical BID Clydie BraunSmith, Rondell A, MD   1 application at 10/31/17 1058     Discharge Medications: Please see discharge summary for a list of discharge medications.  Relevant Imaging Results:  Relevant Lab Results:   Additional Information SS#[id number]  Ayeden Gladman H [place], [place]

## 2017-10-31 NOTE — Clinical Social Work Note (Signed)
Clinical Social Work Assessment  Patient Details  Name: Kenneth Hampton MRN: [id number] Date of Birth: 31-Jul-1928  Date of referral:  10/31/17               Reason for consult:  Facility Placement                Permission sought to share information with:    Permission granted to share information::  Yes, Verbal Permission Granted  Name::        Agency::     Relationship::     Contact Information:     Housing/Transportation Living arrangements for the past 2 months:  Assisted Living Facility Source of Information:  Adult Children, Spouse Patient Interpreter Needed:  None Criminal Activity/Legal Involvement Pertinent to Current Situation/Hospitalization:  No - Comment as needed Significant Relationships:  Adult Children, Spouse Lives with:  Spouse Do you feel safe going back to the place where you live?  Yes Need for family participation in patient care:  Yes (Comment)  Care giving concerns:  Pt currently requiring significant assistance with mobility and decision making (due to hx of dementia) not currently safe to return home with home health and the ALF.    Social Worker assessment / plan:CSW spoke with pt daughter on phone, Raynelle FanningJulie states that pt mother and father live together in an apartment at [facility]. She states that pt has assistance from nurse techs but it is not at the level of care that she feels pt can function with currently. She is aware of home health recommendations but again expressed concern about that being enough, pt had a tough night where he was having hallucinations which worried daughter that he is not medically ready to leave. Pt currently inpatient, but discharged. CSW will communicate further with ALF once pt works with PT. At current time pt daughter amenable to [facility]   CSW also called and left message for pt wife. No answer, HIPPA compliant message left.   Employment status:  Retired Health and safety inspectornsurance information:  Medicare PT  Recommendations:  Not assessed at this time Information / Referral to community resources:  (HH; [place] [employer] caregiver support group)  Patient/Family's Response to care:  Pt daughter supportive, amenable to speaking with CSW. Would like pt wife involved in all care decisions as well as her.   Patient/Family's Understanding of and Emotional Response to Diagnosis, Current Treatment, and Prognosis:  Pt daughter states understanding of diagnosis, current treatment and prognosis. Pt daughter would like more services than provided at ALF for pt at this time, she is worried that he does not have enough assistance at ALF.   Emotional Assessment Appearance:  Appears stated age Attitude/Demeanor/Rapport:  Unable to Assess Affect (typically observed):  Unable to Assess Orientation:  Oriented to Self, Oriented to Place Alcohol / Substance use:  Not Applicable Psych involvement (Current and /or in the community):  No (Comment)  Discharge Needs  Concerns to be addressed:  Care Coordination, Discharge Planning Concerns Readmission within the last 30 days:  No Current discharge risk:  Cognitively Impaired, Physical Impairment Barriers to Discharge:  Barriers Resolved   Doy Hutchingsabel H Teisha Trowbridge, LCSWA 10/31/2017, 2:28 PM

## 2017-10-31 NOTE — NC FL2 (Signed)
[place] MEDICAID FL2 LEVEL OF CARE SCREENING TOOL     IDENTIFICATION  Patient Name: Kenneth Hampton Birthdate: 10-07-1928 Sex: male Admission Date (Current Location): 10/29/2017  [facility] and [place] Number:  Producer, television/film/video and Address:  [facility] [place], [address] [place], [place] [address]      Provider Number: [id number]  Attending Physician Name and Address:  Coralie Keens  Relative Name and Phone Number:       Current Level of Care: Hospital Recommended Level of Care: Assisted Living Facility Prior Approval Number:    Date Approved/Denied:   PASRR Number:    Discharge Plan: Other (Comment)(ALF)    Current Diagnoses: Patient Active Problem List   Diagnosis Date Noted  . Rectal bleeding 10/30/2017  . Dementia 10/30/2017  . CAD (coronary artery disease) 10/30/2017  . HTN (hypertension) 10/30/2017  . Hypothyroidism 10/30/2017  . GI bleed 10/30/2017    Orientation RESPIRATION BLADDER Height & Weight     (fluctuating orientation due to memory impairment)  Normal Incontinent, External catheter Weight: 196 lb (88.9 kg) Height:  5\' 4"  (162.6 cm)  BEHAVIORAL SYMPTOMS/MOOD NEUROLOGICAL BOWEL NUTRITION STATUS     hx of dementia Incontinent Diet(regular diet)  AMBULATORY STATUS COMMUNICATION OF NEEDS Skin   Limited Assist Verbally Other (Comment)(wounds on buttocks with foam dressing)                       Personal Care Assistance Level of Assistance  Bathing, Feeding, Dressing Bathing Assistance: Limited assistance Feeding assistance: Independent Dressing Assistance: Limited assistance     Functional Limitations Info  Sight, Hearing, Speech Sight Info: Adequate Hearing Info: Impaired Speech Info: Impaired    SPECIAL CARE FACTORS FREQUENCY  PT (By licensed PT)     PT Frequency: 5x week              Contractures Contractures Info: Not present    Additional Factors Info  Code Status, Allergies,  Psychotropic Code Status Info: Full Code Allergies Info: No Known Allergies Psychotropic Info: donepezil (ARICEPT) tablet 10 mg daily PO; mirtazapine (REMERON) tablet 15 mg daily at bedtime PO; memantine (NAMENDA) tablet 10 mg 2x daily PO         Current Medications (10/31/2017):  This is the current hospital active medication list Current Facility-Administered Medications  Medication Dose Route Frequency Provider Last Rate Last Dose  . 0.9 %  sodium chloride infusion   Intravenous Continuous Madelyn Flavors A, MD 75 mL/hr at 10/31/17 0355    . acetaminophen (TYLENOL) tablet 650 mg  650 mg Oral Q6H PRN Madelyn Flavors A, MD   650 mg at 10/30/17 1459   Or  . acetaminophen (TYLENOL) suppository 650 mg  650 mg Rectal Q6H PRN Smith, Rondell A, MD      . albuterol (PROVENTIL) (2.5 MG/3ML) 0.083% nebulizer solution 2.5 mg  2.5 mg Nebulization Q6H PRN Smith, Rondell A, MD      . atorvastatin (LIPITOR) tablet 40 mg  40 mg Oral QHS Smith, Rondell A, MD   40 mg at 10/30/17 2116  . docusate sodium (COLACE) capsule 100 mg  100 mg Oral QHS Smith, Rondell A, MD   100 mg at 10/30/17 2116  . donepezil (ARICEPT) tablet 10 mg  10 mg Oral Daily Katrinka Blazing, Rondell A, MD   10 mg at 10/31/17 1055  . ferrous sulfate tablet 325 mg  325 mg Oral Q breakfast Madelyn Flavors A, MD   325 mg at  10/31/17 1059  . furosemide (LASIX) tablet 20 mg  20 mg Oral Daily Smith, Rondell A, MD   20 mg at 10/31/17 1055  . HYDROcodone-acetaminophen (NORCO/VICODIN) 5-325 MG per tablet 1-2 tablet  1-2 tablet Oral Q6H PRN Madelyn FlavorsSmith, Rondell A, MD   1 tablet at 10/30/17 0137  . hydroxypropyl methylcellulose / hypromellose (ISOPTO TEARS / GONIOVISC) 2.5 % ophthalmic solution 1 drop  1 drop Both Eyes TID Madelyn FlavorsSmith, Rondell A, MD   1 drop at 10/31/17 1056  . levothyroxine (SYNTHROID, LEVOTHROID) tablet 50 mcg  50 mcg Oral QAC breakfast Madelyn FlavorsSmith, Rondell A, MD   50 mcg at 10/31/17 1055  . lisinopril (PRINIVIL,ZESTRIL) tablet 5 mg  5 mg Oral Daily Smith, Rondell A,  MD   5 mg at 10/31/17 1056  . loratadine (CLARITIN) tablet 10 mg  10 mg Oral QHS Madelyn FlavorsSmith, Rondell A, MD   10 mg at 10/30/17 2117  . LORazepam (ATIVAN) tablet 0.5 mg  0.5 mg Oral BID Katrinka BlazingSmith, Rondell A, MD   0.5 mg at 10/31/17 1055  . memantine (NAMENDA) tablet 10 mg  10 mg Oral BID Madelyn FlavorsSmith, Rondell A, MD   10 mg at 10/31/17 1055  . mirtazapine (REMERON) tablet 15 mg  15 mg Oral QHS Madelyn FlavorsSmith, Rondell A, MD   15 mg at 10/30/17 2114  . ondansetron (ZOFRAN) tablet 4 mg  4 mg Oral Q6H PRN Madelyn FlavorsSmith, Rondell A, MD       Or  . ondansetron (ZOFRAN) injection 4 mg  4 mg Intravenous Q6H PRN Smith, Rondell A, MD      . pantoprazole (PROTONIX) EC tablet 40 mg  40 mg Oral Daily Smith, Rondell A, MD   40 mg at 10/31/17 1055  . propafenone (RYTHMOL) tablet 150 mg  150 mg Oral TID Madelyn FlavorsSmith, Rondell A, MD   150 mg at 10/31/17 1058  . tamsulosin (FLOMAX) capsule 0.4 mg  0.4 mg Oral Daily Smith, Rondell A, MD   0.4 mg at 10/31/17 1056  . triamcinolone cream (KENALOG) 0.1 % 1 application  1 application Topical BID Clydie BraunSmith, Rondell A, MD   1 application at 10/31/17 1058     Discharge Medications: Please see discharge summary for a list of discharge medications.  Relevant Imaging Results:  Relevant Lab Results:   Additional Information [id number]; RN Case Manager to fax orders for [facility] PT/RN  Doy HutchingIsabel H Kierah Goatley, LCSWA

## 2017-10-31 NOTE — Progress Notes (Signed)
Paged provider pt vitals BP 180/72, HR 81, SPO2 89 but pt was waving arms in air during; hallucinating.

## 2017-10-31 NOTE — Evaluation (Signed)
Physical Therapy Evaluation Patient Details Name: Kenneth Hampton MRN: [id number] DOB: [birth year] Today's Date: 10/31/2017   History of Present Illness  Pt is an 84 y/o male admitted secondary to rectal bleeding. Per pt's wife pt with wounds on bottom. CT of abdomen revealed nonspecific small hypodense lesion in L hepatic lobe. PMH includes dementia, HTN, CAD s/p CABG, and s/p pacemaker.   Clinical Impression  Pt admitted with problem above and deficits below. PTA, pt was able to ambulate with supervision of staff at ALF and RW. Upon eval, pt requiring max-total A +2 for bed mobility and to stand at EOB. Pt with heavy posterior lean. Pt's wife also reporting pt with increased cognitive deficits and hallucinations as compared to baseline. Feel pt is a high fall risk will need increased assist to perform necessary mobility tasks at d/c. Recommending SNF prior to return to ALF. Will continue to follow acutely to maximize functional mobility independence and safety.     Follow Up Recommendations SNF;Supervision/Assistance - 24 hour    Equipment Recommendations  None recommended by PT    Recommendations for Other Services       Precautions / Restrictions Precautions Precautions: Fall Restrictions Weight Bearing Restrictions: No      Mobility  Bed Mobility Overal bed mobility: Needs Assistance Bed Mobility: Supine to Sit;Sit to Supine     Supine to sit: Max assist;+2 for physical assistance Sit to supine: Total assist;+2 for physical assistance   General bed mobility comments: Max A +2 for LE assist and trunk assist during transfer to EOB. Pt very guarded and slightly resistive. Required total A +2 to return to supine.   Transfers Overall transfer level: Needs assistance Equipment used: 2 person hand held assist Transfers: Sit to/from Stand Sit to Stand: Max assist;+2 physical assistance         General transfer comment: Max a +2 for lift assist and to maintain balance in  standing. Pt with posterior lean and required Bilat knee blocking to stand at EOB. Only able to tolerate short period of standing.   Ambulation/Gait             General Gait Details: Unable   Stairs            Wheelchair Mobility    Modified Rankin (Stroke Patients Only)       Balance Overall balance assessment: Needs assistance Sitting-balance support: No upper extremity supported;Feet supported Sitting balance-Leahy Scale: Fair   Postural control: Posterior lean Standing balance support: Bilateral upper extremity supported;During functional activity Standing balance-Leahy Scale: Poor Standing balance comment: Reliant on UE and external support to stand. Pt with posterior lean in standing.                              Pertinent Vitals/Pain Pain Assessment: Faces Faces Pain Scale: Hurts even more Pain Location: generalized Pain Descriptors / Indicators: Aching;Grimacing;Guarding Pain Intervention(s): Limited activity within patient's tolerance;Monitored during session;Repositioned    Home Living Family/patient expects to be discharged to:: Assisted living Living Arrangements: Spouse/significant other             Home Equipment: Walker - 2 wheels      Prior Function Level of Independence: Needs assistance   Gait / Transfers Assistance Needed: Wife reports he would ambulate with RW with supervision.   ADL's / Homemaking Assistance Needed: Wife reports she assisted with bathing and dressing.         Hand Dominance  Extremity/Trunk Assessment   Upper Extremity Assessment Upper Extremity Assessment: Generalized weakness(limited ROM in bilat shoulders. )    Lower Extremity Assessment Lower Extremity Assessment: Generalized weakness    Cervical / Trunk Assessment Cervical / Trunk Assessment: Kyphotic  Communication   Communication: HOH  Cognition Arousal/Alertness: Awake/alert Behavior During Therapy: Restless Overall  Cognitive Status: History of cognitive impairments - at baseline                                 General Comments: History of dementia at baseline, however, wife reports cognition has worsened. Pt with halucinations during session and reports someone standing in room pouring sand. Unable to identify his wife, and pt's wife reports he normally is able to identify her.       General Comments General comments (skin integrity, edema, etc.): BP WFL throughout session. Pt's wife present throughout session. Pt's wife concerned about pt's current mobility deficits.     Exercises     Assessment/Plan    PT Assessment Patient needs continued PT services  PT Problem List Decreased strength;Decreased balance;Decreased mobility;Decreased cognition;Decreased safety awareness;Decreased knowledge of use of DME;Decreased knowledge of precautions;Pain;Decreased coordination       PT Treatment Interventions DME instruction;Gait training;Functional mobility training;Therapeutic activities;Therapeutic exercise;Balance training;Patient/family education    PT Goals (Current goals can be found in the Care Plan section)  Acute Rehab PT Goals Patient Stated Goal: per pt's wife for him to be able to walk  PT Goal Formulation: With patient Time For Goal Achievement: 11/14/17 Potential to Achieve Goals: Fair    Frequency Min 2X/week   Barriers to discharge Decreased caregiver support Wife reports limited staff available to assist.     Co-evaluation               AM-PAC PT "6 Clicks" Daily Activity  Outcome Measure Difficulty turning over in bed (including adjusting bedclothes, sheets and blankets)?: Unable Difficulty moving from lying on back to sitting on the side of the bed? : Unable Difficulty sitting down on and standing up from a chair with arms (e.g., wheelchair, bedside commode, etc,.)?: Unable Help needed moving to and from a bed to chair (including a wheelchair)?: Total Help  needed walking in hospital room?: Total Help needed climbing 3-5 steps with a railing? : Total 6 Click Score: 6    End of Session Equipment Utilized During Treatment: Gait belt Activity Tolerance: Patient tolerated treatment well Patient left: in bed;with call bell/phone within reach;with bed alarm set Nurse Communication: Mobility status PT Visit Diagnosis: Unsteadiness on feet (R26.81);Other symptoms and signs involving the nervous system (R29.898);Muscle weakness (generalized) (M62.81);History of falling (Z91.81);Difficulty in walking, not elsewhere classified (R26.2)    Time: [id number] PT Time Calculation (min) (ACUTE ONLY): 32 min   Charges:   PT Evaluation $PT Eval Moderate Complexity: 1 Mod PT Treatments $Therapeutic Activity: 8-22 mins   PT G Codes:        Gladys DammeBrittany Tharon Bomar, PT, DPT  Acute Rehabilitation Services  Pager: [phone]   Lehman PromBrittany S Lezley Bedgood 10/31/2017, 3:30 PM

## 2017-11-01 DIAGNOSIS — I10 Essential (primary) hypertension: Secondary | ICD-10-CM

## 2017-11-01 DIAGNOSIS — N179 Acute kidney failure, unspecified: Secondary | ICD-10-CM

## 2017-11-01 DIAGNOSIS — N183 Chronic kidney disease, stage 3 (moderate): Secondary | ICD-10-CM

## 2017-11-01 LAB — BASIC METABOLIC PANEL
ANION GAP: 5 (ref 5–15)
BUN: 13 mg/dL (ref 8–23)
CO2: 27 mmol/L (ref 22–32)
Calcium: 8.9 mg/dL (ref 8.9–10.3)
Chloride: 107 mmol/L (ref 98–111)
Creatinine, Ser: 1.22 mg/dL (ref 0.61–1.24)
GFR calc non Af Amer: 51 mL/min — ABNORMAL LOW (ref >60)
GFR, EST AFRICAN AMERICAN: 60 mL/min — AB (ref >60)
Glucose, Bld: 141 mg/dL — ABNORMAL HIGH (ref 70–99)
Potassium: 3.7 mmol/L (ref 3.5–5.1)
SODIUM: 139 mmol/L (ref 135–145)

## 2017-11-01 NOTE — Plan of Care (Signed)
  Problem: Elimination: Goal: Will not experience complications related to urinary retention Outcome: Progressing   Problem: Pain Managment: Goal: General experience of comfort will improve Outcome: Progressing   Problem: Safety: Goal: Ability to remain free from injury will improve Outcome: Progressing   

## 2017-11-01 NOTE — Progress Notes (Signed)
PROGRESS NOTE    Kenneth Hampton  RUE:[id number] DOB: 04-Dec-1928 DOA: 10/29/2017 PCP: Angelica Chessman, MD    Brief Narrative:  84 year old male who presented with rectal bleeding.  He does have the significant past medical history for hypertension, dyslipidemia, coronary disease status post CABG, dementia, and hypothyroidism.  Patient is mainly sedentary, uses a walker and wheelchair for mobility.  All information obtained from his wife at bedside due to his dementia. He was noted to have persistent bright red blood per rectum.  On his initial physical examination blood pressure 136/64, pulse rate 56, respiratory rate 16, oxygen saturation 93%, moist mucous membranes, lungs clear to auscultation bilaterally, heart S1-S2 present, bradycardic, no gallops, rubs or murmurs, the abdomen was soft nontender, no lower extremity edema.  Sodium 137, potassium 4.3, chloride 102, bicarb 27, glucose 135, BUN 20, creatinine 1.51, white count 8.4, hemoglobin 14.5, hematocrit 45.5, platelets 219.  CT of the abdomen pelvis with no acute changes.  EEG atrial and ventricular pacing.  Pacer related to left axis deviation.  Patient was admitted to the hospital with a working diagnosis of rectal bleeding.    Assessment & Plan:   Principal Problem:   Rectal bleeding Active Problems:   Dementia   CAD (coronary artery disease)   HTN (hypertension)   Hypothyroidism   GI bleed   1.  Rectal bleeding, multifactorial.  Hb and hct have remained stable, no signs of further blood loss. Will continue close monitoring.  2.  Coronary disease status post CABG. (1996).  Chest pain-free, continue lisinopril. Will resume aspirin at discharge. Risk vs benefit, will prefer antiplatelet therapy with aspirin.   3.  AKI on chronic kidney disease stage III. Paient with decreased po intake related to severe encephalopathy and confusion, will continue IV fluids with isotonic saline at 75 ml per hour and follow on renal panel. High  risk of worsening renal function. Will discontinue furosemide.   4.  Post pacemaker.  Haert rhythm has remained, atrial paced. Continue propafenone.   5.  Alzheimer's dementia.  Continue donepezil, lorazepam, namenda and mirtazapine.  Has advance disease with acute exacerbation with features of delirium, will continue neuro checks and aspiration precautions. Physical therapy recommendations for SNF.  6.  Hypothyroidism.  On levothyroxine.  7. HTN. Continue blood pressure control with lisinopril.    DVT prophylaxis: heparin sq  Code Status: full Family Communication: I spoke with patient's family at the bedside and all questions were addressed.  Disposition Plan/ discharge barriers: Pending improvement of renal function and placement SNF.    Consultants:     Procedures:     Antimicrobials:       Subjective: Patient this am very somnolent, very difficult to arouse, not in apparent distress. Last night with no agitation. All information per his family at the bedside, patient had difficulty sleeping for the last 48 hours.   Objective: Vitals:   10/30/17 2137 10/31/17 0628 10/31/17 2200 11/01/17 0600  BP: (!) 162/85 (!) 180/72 (!) 172/62 (!) 178/72  Pulse: 90 81 82 90  Resp: 18 18 18 18   Temp: 97.8 F (36.6 C) 98.2 F (36.8 C) 98.7 F (37.1 C) 98.8 F (37.1 C)  TempSrc: Axillary Axillary Axillary Axillary  SpO2: 94% (!) 89% 93% 94%  Weight:      Height:        Intake/Output Summary (Last 24 hours) at 11/01/2017 1216 Last data filed at 11/01/2017 1138 Gross per 24 hour  Intake 1225.05 ml  Output 2350 ml  Net -1124.95 ml   Filed Weights   10/29/17 1913  Weight: 88.9 kg (196 lb)    Examination:   General: deconditioned.  Neurology: very somnolent, very difficult to arouse.   E ENT: positive pallor, no icterus, oral mucosa moist Cardiovascular: No JVD. S1-S2 present, rhythmic, no gallops, rubs, or murmurs. No lower extremity edema. Pulmonary: positive  breath sounds bilaterally, adequate air movement, no wheezing or rales. Scattered rhonchi.  Gastrointestinal. Abdomen with, no organomegaly, non tender, no rebound or guarding Skin. No rashes Musculoskeletal: no joint deformities     Data Reviewed: I have personally reviewed following labs and imaging studies  CBC: Recent Labs  Lab 10/29/17 1939 10/30/17 0505  WBC 8.4 9.3  HGB 14.5 13.2  HCT 45.5 41.2  MCV 98.3 96.9  PLT 219 191   Basic Metabolic Panel: Recent Labs  Lab 10/29/17 1939 10/30/17 0505  NA 137 141  K 4.3 3.7  CL 102 106  CO2 27 28  GLUCOSE 135* 126*  BUN 20 16  CREATININE 1.51* 1.30*  CALCIUM 9.8 9.5   GFR: Estimated Creatinine Clearance: 40.3 mL/min (A) (by C-G formula based on SCr of 1.3 mg/dL (H)). Liver Function Tests: Recent Labs  Lab 10/29/17 1939  AST 27  ALT 29  ALKPHOS 95  BILITOT 0.6  PROT 6.4*  ALBUMIN 3.7   No results for input(s): LIPASE, AMYLASE in the last 168 hours. No results for input(s): AMMONIA in the last 168 hours. Coagulation Profile: Recent Labs  Lab 10/29/17 2022  INR 0.99   Cardiac Enzymes: No results for input(s): CKTOTAL, CKMB, CKMBINDEX, TROPONINI in the last 168 hours. BNP (last 3 results) No results for input(s): PROBNP in the last 8760 hours. HbA1C: No results for input(s): HGBA1C in the last 72 hours. CBG: No results for input(s): GLUCAP in the last 168 hours. Lipid Profile: No results for input(s): CHOL, HDL, LDLCALC, TRIG, CHOLHDL, LDLDIRECT in the last 72 hours. Thyroid Function Tests: No results for input(s): TSH, T4TOTAL, FREET4, T3FREE, THYROIDAB in the last 72 hours. Anemia Panel: No results for input(s): VITAMINB12, FOLATE, FERRITIN, TIBC, IRON, RETICCTPCT in the last 72 hours.    Radiology Studies: I have reviewed all of the imaging during this hospital visit personally     Scheduled Meds: . atorvastatin  40 mg Oral QHS  . docusate sodium  100 mg Oral QHS  . donepezil  10 mg Oral  Daily  . ferrous sulfate  325 mg Oral Q breakfast  . furosemide  20 mg Oral Daily  . hydroxypropyl methylcellulose / hypromellose  1 drop Both Eyes TID  . levothyroxine  50 mcg Oral QAC breakfast  . lisinopril  5 mg Oral Daily  . loratadine  10 mg Oral QHS  . LORazepam  0.5 mg Oral BID  . memantine  10 mg Oral BID  . mirtazapine  15 mg Oral QHS  . pantoprazole  40 mg Oral Daily  . propafenone  150 mg Oral TID  . tamsulosin  0.4 mg Oral Daily  . triamcinolone cream  1 application Topical BID   Continuous Infusions: . sodium chloride 75 mL/hr at 11/01/17 0821     LOS: 2 days        Coralie KeensMauricio Daniel Arrien, MD [facility] Pager [phone]

## 2017-11-01 NOTE — Social Work (Addendum)
CSW provided packet with SNF options to pt and pt daughter/pt granddaughter. Pt family will look at options and f/u with CSW with top choices.   4:43pm- CSW spoke with pt daughter, pt granddaughter toured SNF facilities and the top two choices are [facility] and [facility]. [name] and will be able to accept pt tomorrow. Message left for pt daughter, Raynelle FanningJulie to inform her that [name] is good for placement tomorrow.   CSW continuing to follow.   Doy HutchingIsabel H Jahni Nazar, LCSWA [facility] Clinical Social Work [phone]

## 2017-11-02 LAB — BASIC METABOLIC PANEL
Anion gap: 9 (ref 5–15)
BUN: 11 mg/dL (ref 8–23)
CHLORIDE: 106 mmol/L (ref 98–111)
CO2: 25 mmol/L (ref 22–32)
Calcium: 9 mg/dL (ref 8.9–10.3)
Creatinine, Ser: 1.17 mg/dL (ref 0.61–1.24)
GFR calc non Af Amer: 54 mL/min — ABNORMAL LOW (ref >60)
Glucose, Bld: 119 mg/dL — ABNORMAL HIGH (ref 70–99)
Potassium: 3.9 mmol/L (ref 3.5–5.1)
SODIUM: 140 mmol/L (ref 135–145)

## 2017-11-02 MED ORDER — FUROSEMIDE 20 MG PO TABS: 20.0000 mg | ORAL_TABLET | Freq: Every day | ORAL | 0 refills | Status: AC | PRN

## 2017-11-02 MED ORDER — LORAZEPAM 0.5 MG PO TABS: 0.5000 mg | ORAL_TABLET | Freq: Two times a day (BID) | ORAL | 0 refills | Status: AC

## 2017-11-02 NOTE — Progress Notes (Signed)
Patient was prepared for discharge and transfer via [facility]. Report was called to [facility] at [facility]. Family at bedside aware and will meet patient at facility. Patient left unit in stable condition, IV was removed, site clean, dry and intact.

## 2017-11-02 NOTE — Social Work (Signed)
Clinical Social Worker facilitated patient discharge including contacting patient family and facility to confirm patient discharge plans.  Clinical information faxed to facility and family agreeable with plan.  CSW arranged ambulance transport via [facility] to [facility]. Pick up at 12.  RN to call [phone] with report  prior to discharge.  Clinical Social Worker will sign off for now as social work intervention is no longer needed. Please consult us again if new need arises.  Doy HutchingIsabel H Delon Revelo, [place] Clinical Social Worker [phone]

## 2017-11-02 NOTE — Progress Notes (Signed)
Patient has remained stable, his p.o. intake has increased.  No further agitation overnight. His vital signs are stable this morning, blood pressure 157/57, heart rate 65, temperature 98.9, oxygenation 90% on room air. Moist mucous membranes, lungs clear to auscultation, heart S1-S2 present rhythmic, abdomen soft, no lower extremity edema, neurologically patient is nonfocal, responding to simple questions.  His kidney function has improved with a serum creatinine 1.17. Patient will be discharged today to skilled nursing facility.  Patient's family at bedside and all questions were addressed.

## 2017-11-02 NOTE — Social Work (Signed)
CSW spoke with attending MD, pt stable for discharge [facility]. Will support d/c when discharge paperwork complete.   Doy HutchingIsabel H Najee Manninen, LCSWA [facility] Clinical Social Work [phone]

## 2017-11-02 NOTE — Progress Notes (Signed)
PT Cancellation Note  Patient Details Name: Liz BeachJames Gerstenberger MRN: [id number] DOB: [birth year]   Cancelled Treatment:    Reason Eval/Treat Not Completed: Other (comment). Transportation present to transfer pt to another facility. Will defer to next venue of care.    Alessandra BevelsJennifer M Bryttney Netzer 11/02/2017, 12:21 PM

## 2017-11-02 NOTE — Clinical Social Work Placement (Signed)
   CLINICAL SOCIAL WORK PLACEMENT  NOTE  [name] [phone]  Date:  11/02/2017  Patient Details  Name: Kenneth Hampton MRN: [id number] Date of Birth: [birth year]  Clinical Social Work is seeking post-discharge placement for this patient at the Skilled  Nursing Facility level of care (*CSW will initial, date and re-position this form in  chart as items are completed):  Yes   Patient/family provided with [facility] Clinical Social Work Department's list of facilities offering this level of care within the geographic area requested by the patient (or if unable, by the patient's family).  Yes   Patient/family informed of their freedom to choose among providers that offer the needed level of care, that participate in Medicare, Medicaid or managed care program needed by the patient, have an available bed and are willing to accept the patient.  Yes   Patient/family informed of [facility] ownership interest in [facility] and [facility], as well as of the fact that they are under no obligation to receive care at these facilities.  PASRR submitted to EDS on 11/01/17     PASRR number received on 11/02/17     Existing PASRR number confirmed on       FL2 transmitted to all facilities in geographic area requested by pt/family on 10/31/17     FL2 transmitted to all facilities within larger geographic area on       Patient informed that his/her managed care company has contracts with or will negotiate with certain facilities, including the following:        Yes   Patient/family informed of bed offers received.  [facility]     Physician recommends and patient chooses bed at      [facility].  Patient to be transferred to facility by [facility]     Patient family notified on 11/02/17 of transfer.  Name of family member notified:  grandson at bedside will update pt daughter and pt wife     PHYSICIAN       Additional  Comment:    _______________________________________________ Doy HutchingIsabel H Mardell Suttles, LCSWA 11/02/2017, 10:27 AM

## 2018-01-18 ENCOUNTER — Other Ambulatory Visit: Payer: Self-pay

## 2018-01-18 ENCOUNTER — Emergency Department (HOSPITAL_BASED_OUTPATIENT_CLINIC_OR_DEPARTMENT_OTHER)
Admission: EM | Admit: 2018-01-18 | Discharge: 2018-01-18 | Disposition: A | Discharge status: Home / Self Care | Payer: Medicare Other | Attending: Emergency Medicine | Admitting: Emergency Medicine

## 2018-01-18 ENCOUNTER — Encounter (HOSPITAL_BASED_OUTPATIENT_CLINIC_OR_DEPARTMENT_OTHER): Payer: Self-pay | Admitting: Emergency Medicine

## 2018-01-18 DIAGNOSIS — Y999 Unspecified external cause status: Secondary | ICD-10-CM | POA: Insufficient documentation

## 2018-01-18 DIAGNOSIS — E039 Hypothyroidism, unspecified: Secondary | ICD-10-CM | POA: Diagnosis not present

## 2018-01-18 DIAGNOSIS — Y92009 Unspecified place in unspecified non-institutional (private) residence as the place of occurrence of the external cause: Secondary | ICD-10-CM | POA: Diagnosis not present

## 2018-01-18 DIAGNOSIS — R1031 Right lower quadrant pain: Secondary | ICD-10-CM | POA: Diagnosis present

## 2018-01-18 DIAGNOSIS — F0390 Unspecified dementia without behavioral disturbance: Secondary | ICD-10-CM | POA: Insufficient documentation

## 2018-01-18 DIAGNOSIS — I10 Essential (primary) hypertension: Secondary | ICD-10-CM | POA: Insufficient documentation

## 2018-01-18 DIAGNOSIS — W19XXXA Unspecified fall, initial encounter: Secondary | ICD-10-CM | POA: Insufficient documentation

## 2018-01-18 DIAGNOSIS — E119 Type 2 diabetes mellitus without complications: Secondary | ICD-10-CM | POA: Diagnosis not present

## 2018-01-18 DIAGNOSIS — Z950 Presence of cardiac pacemaker: Secondary | ICD-10-CM | POA: Diagnosis not present

## 2018-01-18 DIAGNOSIS — Y939 Activity, unspecified: Secondary | ICD-10-CM | POA: Diagnosis not present

## 2018-01-18 DIAGNOSIS — I2510 Atherosclerotic heart disease of native coronary artery without angina pectoris: Secondary | ICD-10-CM | POA: Insufficient documentation

## 2018-01-18 DIAGNOSIS — Z79899 Other long term (current) drug therapy: Secondary | ICD-10-CM | POA: Diagnosis not present

## 2018-01-18 NOTE — ED Provider Notes (Signed)
[facility] EMERGENCY DEPARTMENT Provider Note   CSN: [id number] Arrival date & time: 01/18/18  1538     History   Chief Complaint Chief Complaint  Patient presents with  . Fall    HPI Kenneth Hampton is a 84 y.o. male.  Pleasantly demented 84 year old male with a history of diabetes, atherosclerosis, hypertension and hyperlipidemia who is presenting today after a fall in his home.  Patient's wife states that she went up to the office to take care of some business and told him not to get up.  Typically when he gets up he does require assistance.  However when she got back into the room he must of had to go to the bathroom because she found him on the floor by the bathroom door.  He was awake in his normal self but initially was screaming out in pain of his left side.  Staff called paramedics.  Patient was found to have a normal blood sugar in route.  Here he has no complaints of pain.  He states he feels his normal self.  Wife states they have had multiple issues where they live but he had been eating well despite recent admission in July resulting in rehab stay for poor feeding.  She states he ate a big lunch and had been his typical self today prior to the fall.  She states he is unsteady on his feet and forgets to use a walker.  The history is provided by the patient and the spouse.    Past Medical History:  Diagnosis Date  . Atherosclerotic heart disease   . Dementia (HCC)   . Diabetes mellitus without complication (HCC)   . Hyperlipidemia   . Hypertension     Patient Active Problem List   Diagnosis Date Noted  . Rectal bleeding 10/30/2017  . Dementia (HCC) 10/30/2017  . CAD (coronary artery disease) 10/30/2017  . HTN (hypertension) 10/30/2017  . Hypothyroidism 10/30/2017  . GI bleed 10/30/2017    Past Surgical History:  Procedure Laterality Date  . CATARACT EXTRACTION, BILATERAL    . OTHER SURGICAL HISTORY Left    Hareware arm   . PACEMAKER IMPLANT           Home Medications    Prior to Admission medications   Medication Sig Start Date End Date Taking? Authorizing Provider  Ascorbic Acid (VITAMIN C) 100 MG tablet Take 100 mg by mouth at bedtime.     [provider]  atorvastatin (LIPITOR) 40 MG tablet Take 40 mg by mouth at bedtime.     [provider]  Cholecalciferol 2000 units TABS Take 2,000 Units by mouth daily.    [provider]  docusate sodium (COLACE) 100 MG capsule Take 100 mg by mouth at bedtime.     [provider]  donepezil (ARICEPT) 10 MG tablet Take 10 mg by mouth daily.     [provider]  ferrous sulfate 325 (65 FE) MG EC tablet Take 325 mg by mouth daily with breakfast.     [provider]  furosemide (LASIX) 20 MG tablet Take 1 tablet (20 mg total) by mouth daily as needed for fluid or edema (weight gain 3 lbs in 24 hours or 5 lbs in 7 days.). 11/02/17   Arrien, York Ram, MD  Hypromellose (ARTIFICIAL TEARS) 0.4 % SOLN Place 1 drop into both eyes 3 (three) times daily.    [provider]  ketoconazole (NIZORAL) 2 % cream Apply 1 application topically See admin  instructions. Twice daily on Monday and Friday 10/31/17   Arrien, York Ram, MD  ketoconazole (NIZORAL) 2 % shampoo Apply 1 application topically 2 (two) times a week. On Monday and Thursday 11/01/17   Arrien, York Ram, MD  levothyroxine (SYNTHROID, LEVOTHROID) 50 MCG tablet Take 50 mcg by mouth daily before breakfast.    [provider]  lisinopril (PRINIVIL,ZESTRIL) 5 MG tablet Take 5 mg by mouth daily.    [provider]  loratadine (CLARITIN) 10 MG tablet Take 10 mg by mouth at bedtime.     [provider]  LORazepam (ATIVAN) 0.5 MG tablet Take 1 tablet (0.5 mg total) by mouth 2 (two) times daily. 11/02/17   Arrien, York Ram, MD  memantine (NAMENDA) 10 MG tablet Take 10 mg by mouth 2 (two) times daily.    [provider]  mirtazapine  (REMERON) 15 MG tablet Take 15 mg by mouth at bedtime.    [provider]  omeprazole (PRILOSEC) 20 MG capsule Take 20 mg by mouth daily.    [provider]  propafenone (RYTHMOL) 150 MG tablet Take 150 mg by mouth 3 (three) times daily.     [provider]  tamsulosin (FLOMAX) 0.4 MG CAPS capsule Take 0.4 mg by mouth daily.     [provider]  triamcinolone cream (KENALOG) 0.1 % Apply 1 application topically 2 (two) times daily. To legs and arms 10/31/17   Arrien, York Ram, MD    Family History Family History  Problem Relation Age of Onset  . Heart disease Father     Social History Social History   Tobacco Use  . Smoking status: Never Smoker  . Smokeless tobacco: Never Used  Substance Use Topics  . Alcohol use: Not Currently  . Drug use: Not Currently     Allergies   Patient has no known allergies.   Review of Systems Review of Systems  All other systems reviewed and are negative.    Physical Exam Updated Vital Signs BP (!) 143/58   Pulse 69   Temp 97.6 F (36.4 C) (Oral)   Resp 18   SpO2 95%   Physical Exam  Constitutional: He appears well-developed and well-nourished. No distress.  HENT:  Head: Normocephalic and atraumatic.  Mouth/Throat: Oropharynx is clear and moist.  Eyes: Pupils are equal, round, and reactive to light. Conjunctivae and EOM are normal.  Neck: Normal range of motion. Neck supple. No spinous process tenderness and no muscular tenderness present. Normal range of motion present.  Cardiovascular: Normal rate, regular rhythm and intact distal pulses.  No murmur heard. Pulmonary/Chest: Effort normal and breath sounds normal. No respiratory distress. He has no wheezes. He has no rales.  Abdominal: Soft. He exhibits no distension. There is no tenderness. There is no rebound and no guarding.  Musculoskeletal: Normal range of motion. He exhibits no edema or tenderness.  Full range of motion of both hips  without pain.  No pain with palpation along the cervical, thoracic or lumbar spine.  Full flexion and extension of the spine without pain.  Ambulates with a walker without pain  Neurological: He is alert. He has normal strength. No cranial nerve deficit or sensory deficit.  Oriented to person and place.  Patient's gait is slow but no gross ataxia  Skin: Skin is warm and dry. No rash noted. No erythema.  Psychiatric: He has a normal mood and affect. His behavior is normal.  Nursing note and vitals reviewed.    ED Treatments /  Results  Labs (all labs ordered are listed, but only abnormal results are displayed) Labs Reviewed - No data to display  EKG None  Radiology No results found.  Procedures Procedures (including critical care time)  Medications Ordered in ED Medications - No data to display   Initial Impression / Assessment and Plan / ED Course  I have reviewed the triage vital signs and the nursing notes.  Pertinent labs & imaging results that were available during my care of the patient were reviewed by me and considered in my medical decision making (see chart for details).     Elderly male presenting today with what seems to be a mechanical fall in his home.  He does not take anticoagulation and has no evidence of injury to his head.  Patient states he went down on his knees and then was lying on his belly and could not get up.  Wife states at the facility he was screaming in pain from his left side but he states he feels great now and has absolutely no pain.  Ranged all joints without difficulty.  Patient ambulated here without pain.  He had lab work done in July of this year which was normal except for some mild AKI but it resolved with fluids.  Patient has been eating and drinking per wife per normal.  Given there is no evidence of acute injury and he has not had any symptoms of gradual decline or concern for infection at home and vital signs are reassuring.  Will discharge  patient home with his wife.  Final Clinical Impressions(s) / ED Diagnoses   Final diagnoses:  Fall, initial encounter    ED Discharge Orders    None       Gwyneth Sprout, MD 01/18/18 1646

## 2018-01-18 NOTE — ED Triage Notes (Signed)
Patient fell from his knees. Witnessed by spouse.  States that at present he has right flank pain.  Hx of dementia.  Denies head injury, use of blood thinners.

## 2018-03-18 ENCOUNTER — Emergency Department (HOSPITAL_BASED_OUTPATIENT_CLINIC_OR_DEPARTMENT_OTHER)
Admission: EM | Admit: 2018-03-18 | Discharge: 2018-03-18 | Disposition: A | Discharge status: Home / Self Care | Payer: Medicare Other | Attending: Emergency Medicine | Admitting: Emergency Medicine

## 2018-03-18 ENCOUNTER — Other Ambulatory Visit: Payer: Self-pay

## 2018-03-18 ENCOUNTER — Emergency Department (HOSPITAL_BASED_OUTPATIENT_CLINIC_OR_DEPARTMENT_OTHER): Payer: Medicare Other

## 2018-03-18 ENCOUNTER — Encounter (HOSPITAL_BASED_OUTPATIENT_CLINIC_OR_DEPARTMENT_OTHER): Payer: Self-pay

## 2018-03-18 DIAGNOSIS — E039 Hypothyroidism, unspecified: Secondary | ICD-10-CM | POA: Insufficient documentation

## 2018-03-18 DIAGNOSIS — S0990XA Unspecified injury of head, initial encounter: Secondary | ICD-10-CM | POA: Diagnosis present

## 2018-03-18 DIAGNOSIS — Y999 Unspecified external cause status: Secondary | ICD-10-CM | POA: Diagnosis not present

## 2018-03-18 DIAGNOSIS — Z950 Presence of cardiac pacemaker: Secondary | ICD-10-CM | POA: Diagnosis not present

## 2018-03-18 DIAGNOSIS — Z79899 Other long term (current) drug therapy: Secondary | ICD-10-CM | POA: Diagnosis not present

## 2018-03-18 DIAGNOSIS — Y92122 Bedroom in nursing home as the place of occurrence of the external cause: Secondary | ICD-10-CM | POA: Diagnosis not present

## 2018-03-18 DIAGNOSIS — F0390 Unspecified dementia without behavioral disturbance: Secondary | ICD-10-CM | POA: Insufficient documentation

## 2018-03-18 DIAGNOSIS — S0081XA Abrasion of other part of head, initial encounter: Secondary | ICD-10-CM | POA: Diagnosis not present

## 2018-03-18 DIAGNOSIS — I2510 Atherosclerotic heart disease of native coronary artery without angina pectoris: Secondary | ICD-10-CM | POA: Insufficient documentation

## 2018-03-18 DIAGNOSIS — Y939 Activity, unspecified: Secondary | ICD-10-CM | POA: Insufficient documentation

## 2018-03-18 DIAGNOSIS — I10 Essential (primary) hypertension: Secondary | ICD-10-CM | POA: Diagnosis not present

## 2018-03-18 DIAGNOSIS — W06XXXA Fall from bed, initial encounter: Secondary | ICD-10-CM | POA: Insufficient documentation

## 2018-03-18 DIAGNOSIS — W19XXXA Unspecified fall, initial encounter: Secondary | ICD-10-CM

## 2018-03-18 NOTE — ED Triage Notes (Signed)
Pt arrives via [facility] for a fall at [facility]. Apparently pt fell out of his bed onto the floor and hit the right side of his forehead and the back of his head. Pt has an abrasion to right forehead and a hematoma to the posterior head. C-Collar applied by [facility] d/t pt's dementia diagnosis.

## 2018-03-18 NOTE — ED Notes (Signed)
Patient transported to X-ray 

## 2018-03-18 NOTE — ED Provider Notes (Signed)
[facility] EMERGENCY DEPARTMENT Provider Note   CSN: [id number] Arrival date & time: 03/18/18  1523     History   Chief Complaint Chief Complaint  Patient presents with  . Fall    HPI Kenneth Hampton is a 84 y.o. male.  The history is provided by the EMS personnel, the patient and medical records. No language interpreter was used.  Fall    Kenneth Hampton is a 84 y.o. male who presents to the Emergency Department complaining of fall. The five caveat due to dementia. History is obtained by EMS. He presents to the emergency department for evaluation following a fall out of bed. He did hit his head. Patient denies any complaints. He is a resident at [facility] off of [address] Contact for facility is [phone]. Emergency contact is the patient's granddaughter, Kenneth Hampton would at [phone]. He takes 81 mg aspirin daily Past Medical History:  Diagnosis Date  . Atherosclerotic heart disease   . Dementia (HCC)   . Diabetes mellitus without complication (HCC)   . Hyperlipidemia   . Hypertension     Patient Active Problem List   Diagnosis Date Noted  . Rectal bleeding 10/30/2017  . Dementia (HCC) 10/30/2017  . CAD (coronary artery disease) 10/30/2017  . HTN (hypertension) 10/30/2017  . Hypothyroidism 10/30/2017  . GI bleed 10/30/2017    Past Surgical History:  Procedure Laterality Date  . CATARACT EXTRACTION, BILATERAL    . OTHER SURGICAL HISTORY Left    Hareware arm   . PACEMAKER IMPLANT          Home Medications    Prior to Admission medications   Medication Sig Start Date End Date Taking? Authorizing Provider  Ascorbic Acid (VITAMIN C) 100 MG tablet Take 100 mg by mouth at bedtime.     [provider]  atorvastatin (LIPITOR) 40 MG tablet Take 40 mg by mouth at bedtime.     [provider]  Cholecalciferol 2000 units TABS Take 2,000 Units by mouth daily.    [provider]  docusate sodium  (COLACE) 100 MG capsule Take 100 mg by mouth at bedtime.     [provider]  donepezil (ARICEPT) 10 MG tablet Take 10 mg by mouth daily.     [provider]  ferrous sulfate 325 (65 FE) MG EC tablet Take 325 mg by mouth daily with breakfast.     [provider]  furosemide (LASIX) 20 MG tablet Take 1 tablet (20 mg total) by mouth daily as needed for fluid or edema (weight gain 3 lbs in 24 hours or 5 lbs in 7 days.). 11/02/17   Arrien, York RamMauricio Daniel, MD  Hypromellose (ARTIFICIAL TEARS) 0.4 % SOLN Place 1 drop into both eyes 3 (three) times daily.    [provider]  ketoconazole (NIZORAL) 2 % cream Apply 1 application topically See admin instructions. Twice daily on Monday and Friday 10/31/17   Arrien, York RamMauricio Daniel, MD  ketoconazole (NIZORAL) 2 % shampoo Apply 1 application topically 2 (two) times a week. On Monday and Thursday 11/01/17   Arrien, York RamMauricio Daniel, MD  levothyroxine (SYNTHROID, LEVOTHROID) 50 MCG tablet Take 50 mcg by mouth daily before breakfast.    [provider]  lisinopril (PRINIVIL,ZESTRIL) 5 MG tablet Take 5 mg by mouth daily.    [provider]  loratadine (CLARITIN) 10 MG tablet Take 10 mg by mouth at bedtime.     [provider]  LORazepam (ATIVAN) 0.5 MG tablet Take 1 tablet (0.5 mg total) by mouth 2 (two) times daily. 11/02/17   Arrien, York Ram, MD  memantine (NAMENDA) 10 MG tablet Take 10 mg by mouth 2 (two) times daily.    [provider]  mirtazapine (REMERON) 15 MG tablet Take 15 mg by mouth at bedtime.    [provider]  omeprazole (PRILOSEC) 20 MG capsule Take 20 mg by mouth daily.    [provider]  propafenone (RYTHMOL) 150 MG tablet Take 150 mg by mouth 3 (three) times daily.     [provider]  tamsulosin (FLOMAX) 0.4 MG CAPS capsule Take 0.4 mg by mouth daily.     [provider]  triamcinolone cream (KENALOG) 0.1 % Apply 1 application topically  2 (two) times daily. To legs and arms 10/31/17   Arrien, York Ram, MD    Family History Family History  Problem Relation Age of Onset  . Heart disease Father     Social History Social History   Tobacco Use  . Smoking status: Never Smoker  . Smokeless tobacco: Never Used  Substance Use Topics  . Alcohol use: Not Currently  . Drug use: Not Currently     Allergies   Patient has no known allergies.   Review of Systems Review of Systems  All other systems reviewed and are negative.    Physical Exam Updated Vital Signs BP (!) 153/58 (BP Location: Right Arm)   Pulse 70   Temp 97.6 F (36.4 C) (Oral)   Resp 16   Wt 88.9 kg   SpO2 93%   BMI 33.64 kg/m   Physical Exam  Constitutional: He appears well-developed and well-nourished.  HENT:  Head: Normocephalic.  Abrasion to right forehead. Small skin tear to the right lateral orbit  Eyes:  Right pupil 1 mm and reactive, left pupil 2 mm and reactive  Cardiovascular: Normal rate and regular rhythm.  No murmur heard. Pulmonary/Chest: Effort normal and breath sounds normal. No respiratory distress.  Abdominal: Soft. There is no tenderness. There is no rebound and no guarding.  Musculoskeletal: He exhibits no tenderness.  2+ pitting edema to bilateral lower extremities, right greater than left. No tenderness to palpation throughout bilateral lower extremities.  Easily ranges of bilateral hips  Neurological: He is alert.  Markedly confused. Disoriented to place in time. Five out of five strength in all four extremities  Skin: Skin is warm and dry.  Psychiatric: He has a normal mood and affect. His behavior is normal.  Nursing note and vitals reviewed.    ED Treatments / Results  Labs (all labs ordered are listed, but only abnormal results are displayed) Labs Reviewed - No data to display  EKG None  Radiology Ct Head Wo Contrast  Result Date: 03/18/2018 CLINICAL DATA:  Larey Seat and hit head today. EXAM: CT  HEAD WITHOUT CONTRAST CT CERVICAL SPINE WITHOUT CONTRAST TECHNIQUE: Multidetector CT imaging of the head and cervical spine was performed following the standard protocol without intravenous contrast. Multiplanar CT image reconstructions of the cervical spine were also generated. COMPARISON:  02/24/2017 FINDINGS: CT HEAD FINDINGS Brain: Stable age related cerebral atrophy, ventriculomegaly and periventricular white matter disease. No extra-axial fluid collections are identified. No CT findings for acute hemispheric infarction or intracranial hemorrhage. No mass lesions. The brainstem and cerebellum are normal. Vascular: Advanced atherosclerotic calcifications but no hyperdense vessel or unexpected calcification. Skull: No acute skull fracture.  No bone lesions. Sinuses/Orbits: The paranasal sinuses and  mastoid air cells are clear. The globes are intact. Scleral band noted on the right side. Other: No scalp lesions or hematoma. CT CERVICAL SPINE FINDINGS Alignment: Normal overall alignment. Skull base and vertebrae: Advanced degenerative cervical spondylosis with multilevel disc disease and severe multilevel facet disease. No acute cervical spine fracture. The skull base C1 and C1-2 articulations are maintained. Advanced C1-2 degenerative changes. Soft tissues and spinal canal: No prevertebral fluid or swelling. No visible canal hematoma. Disc levels: No acute disc protrusion. Multilevel disc disease and facet disease with multilevel foraminal stenosis. Upper chest: The lung apices are clear. Other: No neck mass or adenopathy. IMPRESSION: 1. No acute intracranial findings or skull fracture. 2. Stable age related cerebral atrophy, ventriculomegaly and periventricular white matter disease. 3. Advanced degenerative cervical spondylosis with multilevel disc disease and facet disease but no acute cervical spine fracture. Electronically Signed   By: Rudie Meyer M.D.   On: 03/18/2018 16:11   Ct Cervical Spine Wo  Contrast  Result Date: 03/18/2018 CLINICAL DATA:  Larey Seat and hit head today. EXAM: CT HEAD WITHOUT CONTRAST CT CERVICAL SPINE WITHOUT CONTRAST TECHNIQUE: Multidetector CT imaging of the head and cervical spine was performed following the standard protocol without intravenous contrast. Multiplanar CT image reconstructions of the cervical spine were also generated. COMPARISON:  02/24/2017 FINDINGS: CT HEAD FINDINGS Brain: Stable age related cerebral atrophy, ventriculomegaly and periventricular white matter disease. No extra-axial fluid collections are identified. No CT findings for acute hemispheric infarction or intracranial hemorrhage. No mass lesions. The brainstem and cerebellum are normal. Vascular: Advanced atherosclerotic calcifications but no hyperdense vessel or unexpected calcification. Skull: No acute skull fracture.  No bone lesions. Sinuses/Orbits: The paranasal sinuses and mastoid air cells are clear. The globes are intact. Scleral band noted on the right side. Other: No scalp lesions or hematoma. CT CERVICAL SPINE FINDINGS Alignment: Normal overall alignment. Skull base and vertebrae: Advanced degenerative cervical spondylosis with multilevel disc disease and severe multilevel facet disease. No acute cervical spine fracture. The skull base C1 and C1-2 articulations are maintained. Advanced C1-2 degenerative changes. Soft tissues and spinal canal: No prevertebral fluid or swelling. No visible canal hematoma. Disc levels: No acute disc protrusion. Multilevel disc disease and facet disease with multilevel foraminal stenosis. Upper chest: The lung apices are clear. Other: No neck mass or adenopathy. IMPRESSION: 1. No acute intracranial findings or skull fracture. 2. Stable age related cerebral atrophy, ventriculomegaly and periventricular white matter disease. 3. Advanced degenerative cervical spondylosis with multilevel disc disease and facet disease but no acute cervical spine fracture. Electronically  Signed   By: Rudie Meyer M.D.   On: 03/18/2018 16:11    Procedures .Marland KitchenLaceration Repair Date/Time: 03/18/2018 4:47 PM Performed by: Tilden Fossa, MD Authorized by: Tilden Fossa, MD   Consent:    Consent obtained:  Verbal   Consent given by:  Patient and guardian   Alternatives discussed:  No treatment Anesthesia (see MAR for exact dosages):    Anesthesia method:  None Laceration details:    Location:  Face   Facial location: right lateral periorbital region.   Wound length (cm): 0.5. Repair type:    Repair type:  Simple Exploration:    Hemostasis achieved with:  Direct pressure Treatment:    Area cleansed with:  Shur-Clens Skin repair:    Repair method:  Tissue adhesive Post-procedure details:    Dressing: gauze.   Patient tolerance of procedure:  Tolerated well, no immediate complications   (including critical care time)  Medications Ordered in  ED Medications - No data to display   Initial Impression / Assessment and Plan / ED Course  I have reviewed the triage vital signs and the nursing notes.  Pertinent labs & imaging results that were available during my care of the patient were reviewed by me and considered in my medical decision making (see chart for details).     Patient with history of dementia here for evaluation after falling out of bed. He does have abrasion to his forehead and a small skin tear to the right Periorbital region. There was some loss of tissue with the skin tear. Tissue adhesive was used to re-approximate the intact skin. Imaging negative for acute abnormality for head/cspine.  Granddaughter is at the bedside and states that he is at his baseline, no recent illnesses. Plan to discharge back to his facility with outpatient follow-up and return precautions.  Final Clinical Impressions(s) / ED Diagnoses   Final diagnoses:  Fall, initial encounter  Abrasion of face, initial encounter    ED Discharge Orders    None       Tilden Fossa, MD 03/18/18 1649

## 2018-03-18 NOTE — ED Notes (Signed)
Pt's granddaughter verbalizes understanding of dc instructions and denies any further needs at this time

## 2018-03-18 NOTE — ED Notes (Signed)
EDP and granddaughter at bedside

## 2018-09-14 ENCOUNTER — Emergency Department (HOSPITAL_BASED_OUTPATIENT_CLINIC_OR_DEPARTMENT_OTHER): Payer: Medicare Other

## 2018-09-14 ENCOUNTER — Emergency Department (HOSPITAL_BASED_OUTPATIENT_CLINIC_OR_DEPARTMENT_OTHER)
Admission: EM | Admit: 2018-09-14 | Discharge: 2018-09-14 | Disposition: A | Discharge status: Skilled Nursing Facility | Payer: Medicare Other | Attending: Emergency Medicine | Admitting: Emergency Medicine

## 2018-09-14 ENCOUNTER — Other Ambulatory Visit: Payer: Self-pay

## 2018-09-14 ENCOUNTER — Encounter (HOSPITAL_BASED_OUTPATIENT_CLINIC_OR_DEPARTMENT_OTHER): Payer: Self-pay | Admitting: Emergency Medicine

## 2018-09-14 DIAGNOSIS — E039 Hypothyroidism, unspecified: Secondary | ICD-10-CM | POA: Insufficient documentation

## 2018-09-14 DIAGNOSIS — I2510 Atherosclerotic heart disease of native coronary artery without angina pectoris: Secondary | ICD-10-CM | POA: Diagnosis not present

## 2018-09-14 DIAGNOSIS — Y92129 Unspecified place in nursing home as the place of occurrence of the external cause: Secondary | ICD-10-CM | POA: Diagnosis not present

## 2018-09-14 DIAGNOSIS — W07XXXA Fall from chair, initial encounter: Secondary | ICD-10-CM | POA: Insufficient documentation

## 2018-09-14 DIAGNOSIS — I10 Essential (primary) hypertension: Secondary | ICD-10-CM | POA: Insufficient documentation

## 2018-09-14 DIAGNOSIS — Y9389 Activity, other specified: Secondary | ICD-10-CM | POA: Diagnosis not present

## 2018-09-14 DIAGNOSIS — F0390 Unspecified dementia without behavioral disturbance: Secondary | ICD-10-CM | POA: Diagnosis not present

## 2018-09-14 DIAGNOSIS — E119 Type 2 diabetes mellitus without complications: Secondary | ICD-10-CM | POA: Diagnosis not present

## 2018-09-14 DIAGNOSIS — Z79899 Other long term (current) drug therapy: Secondary | ICD-10-CM | POA: Insufficient documentation

## 2018-09-14 DIAGNOSIS — Y999 Unspecified external cause status: Secondary | ICD-10-CM | POA: Diagnosis not present

## 2018-09-14 DIAGNOSIS — S5001XA Contusion of right elbow, initial encounter: Secondary | ICD-10-CM | POA: Insufficient documentation

## 2018-09-14 DIAGNOSIS — W19XXXA Unspecified fall, initial encounter: Secondary | ICD-10-CM

## 2018-09-14 DIAGNOSIS — S59901A Unspecified injury of right elbow, initial encounter: Secondary | ICD-10-CM | POA: Diagnosis present

## 2018-09-14 HISTORY — DX: Disorder of kidney and ureter, unspecified: N28.9

## 2018-09-14 HISTORY — DX: Disorder of thyroid, unspecified: E07.9

## 2018-09-14 NOTE — Discharge Instructions (Signed)
Fall precautions.   You had CT head and xrays that showed no fractures   See your doctor  Return to ER if you have worse pain, headaches, vomiting

## 2018-09-14 NOTE — ED Triage Notes (Signed)
Pt from [place], he fell while staff was helping him to his chair. He is confused at baseline. Per EMS pt has skin tear to R elbow, c-collar in place. Pt with no complaints

## 2018-09-14 NOTE — ED Notes (Signed)
Dried Blood spots visible on back of patients shirt when removed from patient

## 2018-09-14 NOTE — ED Provider Notes (Signed)
[facility] EMERGENCY DEPARTMENT Provider Note   CSN: [id number] Arrival date & time: 09/14/18  1541    History   Chief Complaint Chief Complaint  Patient presents with  . Fall    HPI Kenneth Hampton is a 85 y.o. male hx of DM, HL, HTN, dementia here presenting with fall.  Patient is from [facility].  Patient apparently was helped to a chair and accidentally fell and hit his right elbow.  It was unclear if he had a head injury or not.  Patient is unable to tell me what happened as he is demented at baseline.  Patient had a witnessed fall and did not have a syncope per the nursing home.     The history is provided by the nursing home and the EMS personnel.   Level V caveat- dementia   Past Medical History:  Diagnosis Date  . Atherosclerotic heart disease   . Dementia (HCC)   . Diabetes mellitus without complication (HCC)   . Hyperlipidemia   . Hypertension   . Renal disorder   . Thyroid disease     Patient Active Problem List   Diagnosis Date Noted  . Rectal bleeding 10/30/2017  . Dementia (HCC) 10/30/2017  . CAD (coronary artery disease) 10/30/2017  . HTN (hypertension) 10/30/2017  . Hypothyroidism 10/30/2017  . GI bleed 10/30/2017    Past Surgical History:  Procedure Laterality Date  . CATARACT EXTRACTION, BILATERAL    . OTHER SURGICAL HISTORY Left    Hareware arm   . PACEMAKER IMPLANT          Home Medications    Prior to Admission medications   Medication Sig Start Date End Date Taking? Authorizing Provider  Ascorbic Acid (VITAMIN C) 100 MG tablet Take 100 mg by mouth at bedtime.     [provider]  atorvastatin (LIPITOR) 40 MG tablet Take 40 mg by mouth at bedtime.     [provider]  Cholecalciferol 2000 units TABS Take 2,000 Units by mouth daily.    [provider]  docusate sodium (COLACE) 100 MG capsule Take 100 mg by mouth at bedtime.     [provider]  donepezil (ARICEPT) 10 MG tablet  Take 10 mg by mouth daily.     [provider]  ferrous sulfate 325 (65 FE) MG EC tablet Take 325 mg by mouth daily with breakfast.     [provider]  furosemide (LASIX) 20 MG tablet Take 1 tablet (20 mg total) by mouth daily as needed for fluid or edema (weight gain 3 lbs in 24 hours or 5 lbs in 7 days.). 11/02/17   Arrien, York RamMauricio Daniel, MD  Hypromellose (ARTIFICIAL TEARS) 0.4 % SOLN Place 1 drop into both eyes 3 (three) times daily.    [provider]  ketoconazole (NIZORAL) 2 % cream Apply 1 application topically See admin instructions. Twice daily on Monday and Friday 10/31/17   Arrien, York RamMauricio Daniel, MD  ketoconazole (NIZORAL) 2 % shampoo Apply 1 application topically 2 (two) times a week. On Monday and Thursday 11/01/17   Arrien, York RamMauricio Daniel, MD  levothyroxine (SYNTHROID, LEVOTHROID) 50 MCG tablet Take 50 mcg by mouth daily before breakfast.    [provider]  lisinopril (PRINIVIL,ZESTRIL) 5 MG tablet Take 5 mg by mouth daily.    [provider]  loratadine (CLARITIN) 10 MG tablet Take 10 mg by mouth at bedtime.     [provider]  LORazepam (ATIVAN) 0.5 MG tablet Take  1 tablet (0.5 mg total) by mouth 2 (two) times daily. 11/02/17   Arrien, York Ram, MD  memantine (NAMENDA) 10 MG tablet Take 10 mg by mouth 2 (two) times daily.    [provider]  mirtazapine (REMERON) 15 MG tablet Take 15 mg by mouth at bedtime.    [provider]  omeprazole (PRILOSEC) 20 MG capsule Take 20 mg by mouth daily.    [provider]  propafenone (RYTHMOL) 150 MG tablet Take 150 mg by mouth 3 (three) times daily.     [provider]  tamsulosin (FLOMAX) 0.4 MG CAPS capsule Take 0.4 mg by mouth daily.     [provider]  triamcinolone cream (KENALOG) 0.1 % Apply 1 application topically 2 (two) times daily. To legs and arms 10/31/17   Arrien, York Ram, MD    Family History Family History   Problem Relation Age of Onset  . Heart disease Father     Social History Social History   Tobacco Use  . Smoking status: Never Smoker  . Smokeless tobacco: Never Used  Substance Use Topics  . Alcohol use: Not Currently  . Drug use: Not Currently     Allergies   Patient has no known allergies.   Review of Systems Review of Systems  Musculoskeletal:       R elbow pain   All other systems reviewed and are negative.    Physical Exam Updated Vital Signs BP (!) 164/90 (BP Location: Left Arm)   Pulse (!) 59   Temp 97.7 F (36.5 C) (Tympanic)   Resp 20   SpO2 99%   Physical Exam Vitals signs and nursing note reviewed.  Constitutional:      Comments: Demented   HENT:     Head: Normocephalic.     Comments: Dry blood L ear with abrasion, no obvious laceration or scalp hematoma     Mouth/Throat:     Mouth: Mucous membranes are moist.  Eyes:     Extraocular Movements: Extraocular movements intact.     Pupils: Pupils are equal, round, and reactive to light.  Neck:     Musculoskeletal: Normal range of motion.  Cardiovascular:     Rate and Rhythm: Normal rate and regular rhythm.  Pulmonary:     Effort: Pulmonary effort is normal.     Breath sounds: Normal breath sounds.  Abdominal:     General: Abdomen is flat.     Palpations: Abdomen is soft.  Musculoskeletal:     Comments: There are multiple bruises on L upper extremity that appears old. Mild L proximal humerus tenderness. Skin tear R elbow that appears new but bleeding controlled, able to range R elbow. No midline spinal tenderness, nl ROM bilateral hips. No other obvious extremity trauma. No sacral decub.   Skin:    General: Skin is warm.     Capillary Refill: Capillary refill takes less than 2 seconds.  Neurological:     General: No focal deficit present.     Mental Status: He is alert.     Comments: Demented, moving all extremities   Psychiatric:        Mood and Affect: Mood normal.      ED Treatments  / Results  Labs (all labs ordered are listed, but only abnormal results are displayed) Labs Reviewed - No data to display  EKG None  Radiology Dg Chest 1 View  Result Date: 09/14/2018 CLINICAL DATA:  Dementia with fall and skin tear. EXAM: CHEST  1 VIEW COMPARISON:  12/14/2017 FINDINGS: Dual-chamber pacer leads from the left in stable position. Mild retrocardiac linear scar. There is no edema, consolidation, effusion, or pneumothorax. No acute osseous finding. IMPRESSION: Stable from prior.  No acute finding. Electronically Signed   By: Marnee Spring M.D.   On: 09/14/2018 17:07   Dg Pelvis 1-2 Views  Result Date: 09/14/2018 CLINICAL DATA:  Fall EXAM: PELVIS - 1-2 VIEW COMPARISON:  None. FINDINGS: Changes of right hip replacement. No acute bony abnormality. Specifically, no fracture, subluxation, or dislocation. IMPRESSION: Right hip replacement.  No acute bony abnormality. Electronically Signed   By: Charlett Nose M.D.   On: 09/14/2018 17:07   Dg Elbow Complete Right  Result Date: 09/14/2018 CLINICAL DATA:  Fall, arm pain EXAM: RIGHT ELBOW - COMPLETE 3+ VIEW COMPARISON:  None. FINDINGS: Degenerative changes with spurring. No joint effusion. No acute bony abnormality. Specifically, no fracture, subluxation, or dislocation. IMPRESSION: No acute bony abnormality. Electronically Signed   By: Charlett Nose M.D.   On: 09/14/2018 17:06   Dg Forearm Left  Result Date: 09/14/2018 CLINICAL DATA:  Fall, arm pain EXAM: LEFT FOREARM - 2 VIEW COMPARISON:  None. FINDINGS: Intramedullary rod noted within the left radius related to remote healed injury. Old healed mid right ulnar fracture. No acute fracture, subluxation or dislocation. IMPRESSION: No acute bony abnormality. Electronically Signed   By: Charlett Nose M.D.   On: 09/14/2018 17:08   Ct Head Wo Contrast  Result Date: 09/14/2018 CLINICAL DATA:  Fall, head trauma EXAM: CT HEAD WITHOUT CONTRAST CT CERVICAL SPINE WITHOUT CONTRAST TECHNIQUE:  Multidetector CT imaging of the head and cervical spine was performed following the standard protocol without intravenous contrast. Multiplanar CT image reconstructions of the cervical spine were also generated. COMPARISON:  None. FINDINGS: CT HEAD FINDINGS Brain: There is atrophy and chronic small vessel disease changes. Old right basal ganglia lacunar infarct. No acute intracranial abnormality. Specifically, no hemorrhage, hydrocephalus, mass lesion, acute infarction, or significant intracranial injury. Vascular: No hyperdense vessel or unexpected calcification. Skull: No acute calvarial abnormality. Sinuses/Orbits: Visualized paranasal sinuses and mastoids clear. Orbital soft tissues unremarkable. Other: None CT CERVICAL SPINE FINDINGS Alignment: No subluxation Skull base and vertebrae: No acute fracture. No primary bone lesion or focal pathologic process. Soft tissues and spinal canal: No prevertebral fluid or swelling. No visible canal hematoma. Disc levels: Severe diffuse degenerative disc disease and facet disease. Upper chest: No acute findings Other: None IMPRESSION: Atrophy, chronic microvascular disease. No acute intracranial abnormality. Advanced degenerative changes in the cervical spine. No acute bony abnormality. Electronically Signed   By: Charlett Nose M.D.   On: 09/14/2018 16:44   Ct Cervical Spine Wo Contrast  Result Date: 09/14/2018 CLINICAL DATA:  Fall, head trauma EXAM: CT HEAD WITHOUT CONTRAST CT CERVICAL SPINE WITHOUT CONTRAST TECHNIQUE: Multidetector CT imaging of the head and cervical spine was performed following the standard protocol without intravenous contrast. Multiplanar CT image reconstructions of the cervical spine were also generated. COMPARISON:  None. FINDINGS: CT HEAD FINDINGS Brain: There is atrophy and chronic small vessel disease changes. Old right basal ganglia lacunar infarct. No acute intracranial abnormality. Specifically, no hemorrhage, hydrocephalus, mass lesion,  acute infarction, or significant intracranial injury. Vascular: No hyperdense vessel or unexpected calcification. Skull: No acute calvarial abnormality. Sinuses/Orbits: Visualized paranasal sinuses and mastoids clear. Orbital soft tissues unremarkable. Other: None CT CERVICAL SPINE FINDINGS Alignment: No subluxation Skull base and vertebrae: No acute fracture. No primary bone lesion or focal pathologic process. Soft tissues and  spinal canal: No prevertebral fluid or swelling. No visible canal hematoma. Disc levels: Severe diffuse degenerative disc disease and facet disease. Upper chest: No acute findings Other: None IMPRESSION: Atrophy, chronic microvascular disease. No acute intracranial abnormality. Advanced degenerative changes in the cervical spine. No acute bony abnormality. Electronically Signed   By: Charlett Nose M.D.   On: 09/14/2018 16:44   Dg Humerus Left  Result Date: 09/14/2018 CLINICAL DATA:  Fall, arm pain EXAM: LEFT HUMERUS - 2+ VIEW COMPARISON:  None. FINDINGS: Degenerative changes in the left [facility] joint and left elbow. No acute bony abnormality. Specifically, no fracture, subluxation, or dislocation. IMPRESSION: No acute bony abnormality. Electronically Signed   By: Charlett Nose M.D.   On: 09/14/2018 17:07    Procedures Procedures (including critical care time)  Medications Ordered in ED Medications - No data to display   Initial Impression / Assessment and Plan / ED Course  I have reviewed the triage vital signs and the nursing notes.  Pertinent labs & imaging results that were available during my care of the patient were reviewed by me and considered in my medical decision making (see chart for details).       Graesyn Schreifels is a 85 y.o. male here with fall. Mechanical fall and had possible head injury. Will get CT head/neck, xrays.   5:14 PM CT and xrays unremarkable. Stable for discharge back to facility.   Final Clinical Impressions(s) / ED Diagnoses   Final diagnoses:   None    ED Discharge Orders    None       Charlynne Pander, MD 09/14/18 1714

## 2018-09-14 NOTE — ED Notes (Signed)
[facility] called for transport.  

## 2018-09-14 NOTE — ED Notes (Signed)
ED Provider at bedside. 

## 2018-09-14 NOTE — ED Notes (Signed)
Pt verbalized left arm/shoulder pain when attempting to move arm to apply BP cuff

## 2018-09-14 NOTE — ED Notes (Signed)
Pt in gown, examined by EDP Dr Kris Hartmann RN assisted EDP.

## 2018-09-14 NOTE — ED Notes (Signed)
Lm For return call at [facility] [phone]) regarding discharge for patient.

## 2018-12-17 DEATH — deceased

## 2019-01-28 IMAGING — CT CT CERVICAL SPINE W/O CM
3 of 7 series · 13 of 33 positions shown, 15 images · non-contrast
Comparison: 02/24/2017

CLINICAL DATA: Fell and hit head today.

EXAM:
CT HEAD WITHOUT CONTRAST
CT CERVICAL SPINE WITHOUT CONTRAST
TECHNIQUE: Multidetector CT imaging of the head and cervical spine was
performed following the standard protocol without intravenous
contrast. Multiplanar CT image reconstructions of the cervical spine
were also generated.

[Series 4: head 3.0 mpr cor · coronal · 0.34mm/px · 3 of 68 slices shown]
[im 17/68  bone]
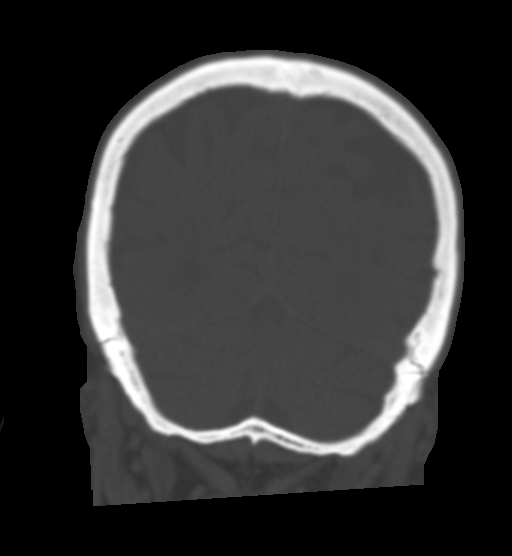
[im 34/68  bone]
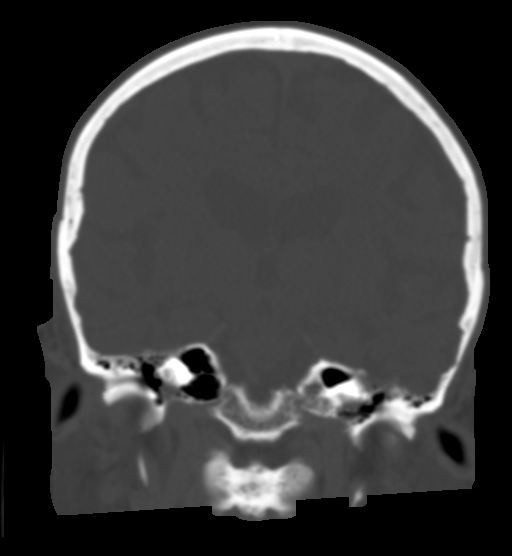
[im 51/68  bone]
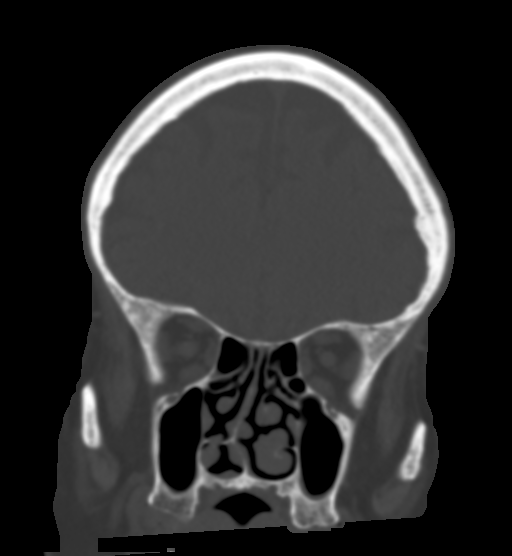

[Series 10: sagittals · sagittal · 0.27mm/px · 5 of 64 slices shown]
[im 11/64  bone]
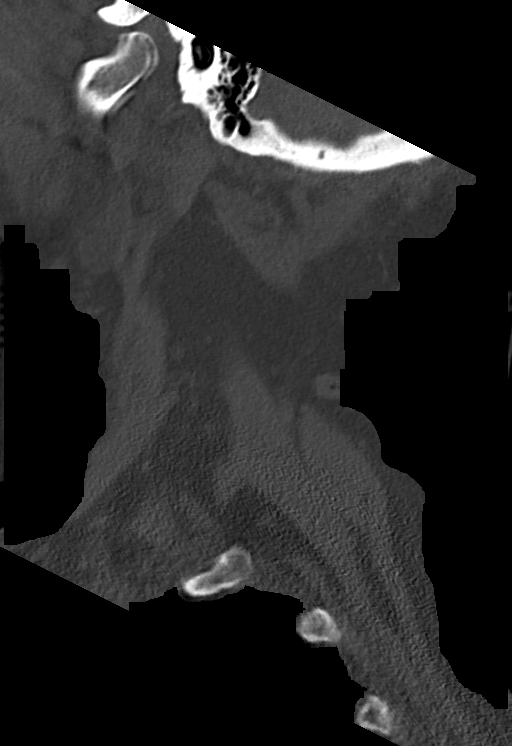
[im 22/64  bone]
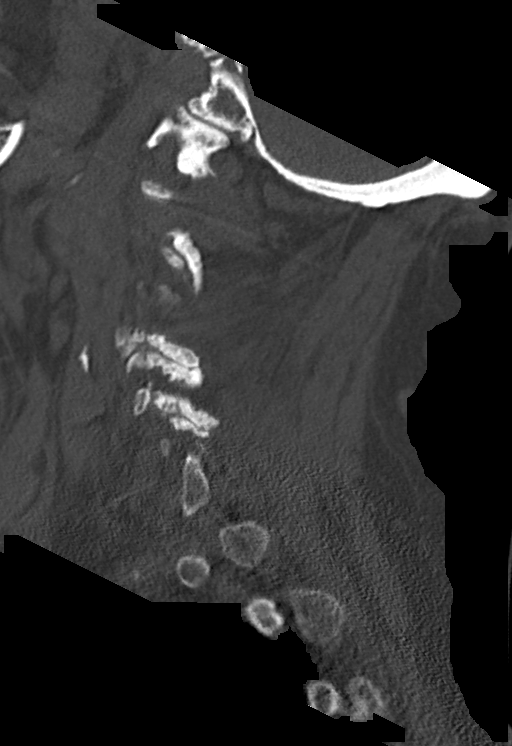
[im 32/64  bone]
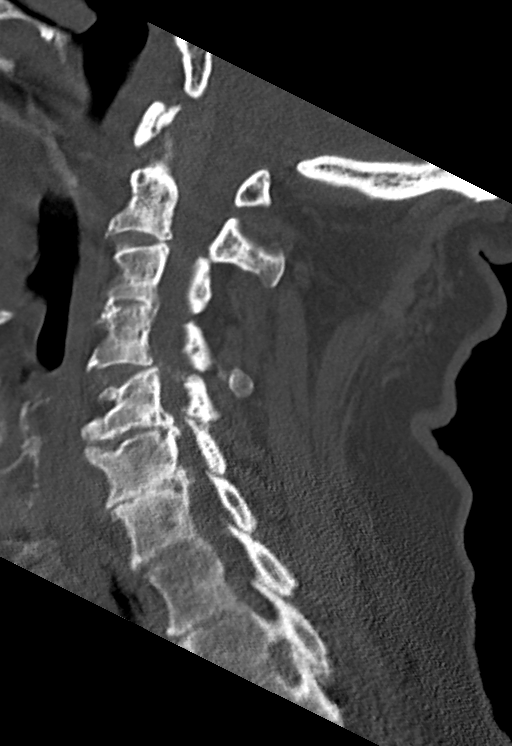
[im 43/64  bone]
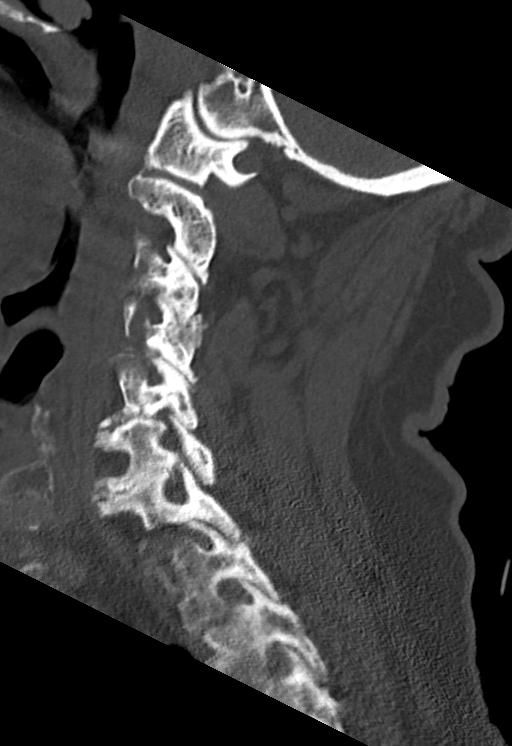
[im 53/64  bone]
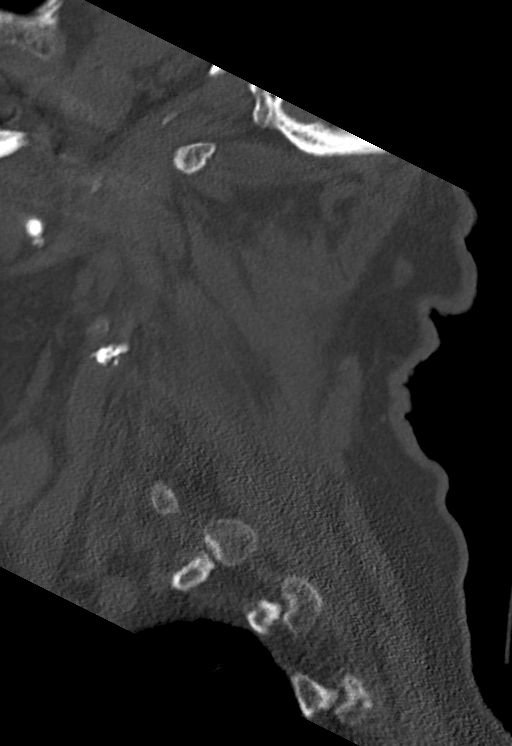

[Series 11: orthogonals · axial · 0.25mm/px · z∈[+726,+848]mm · 5 of 103 slices shown, 7 images]
[im 18/103  soft-tissue]
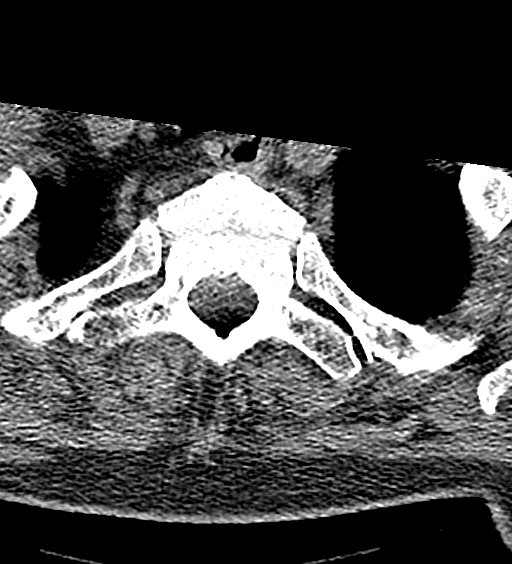
[im 18/103  bone]
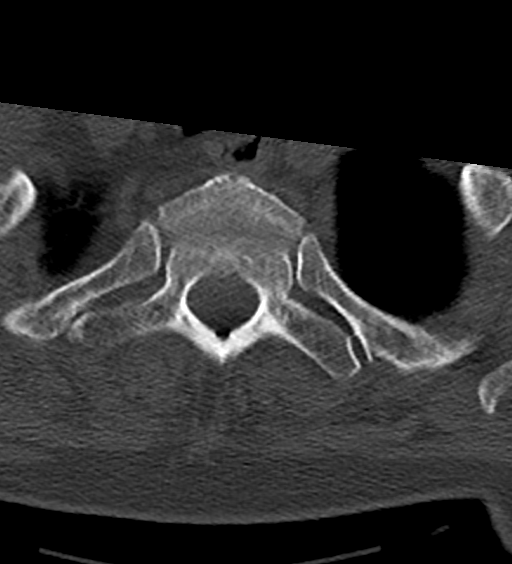
[im 35/103  bone]
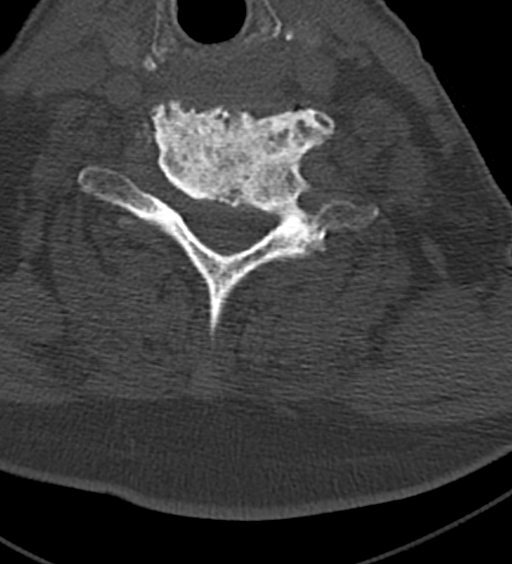
[im 52/103  bone]
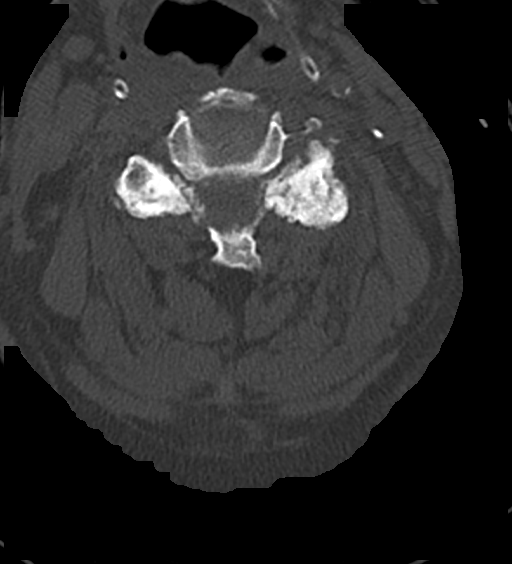
[im 69/103  bone]
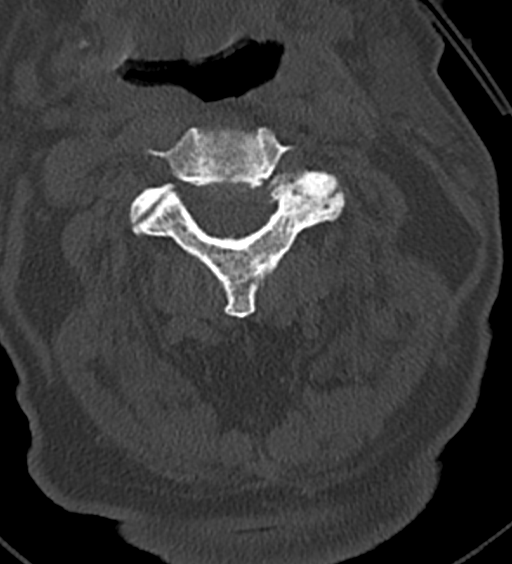
[im 86/103  soft-tissue]
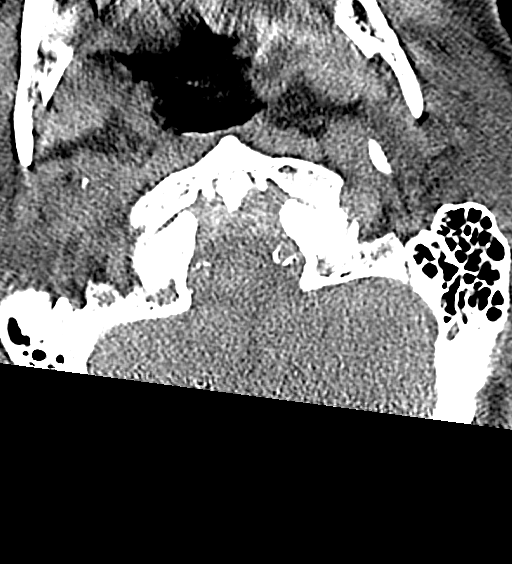
[im 86/103  bone]
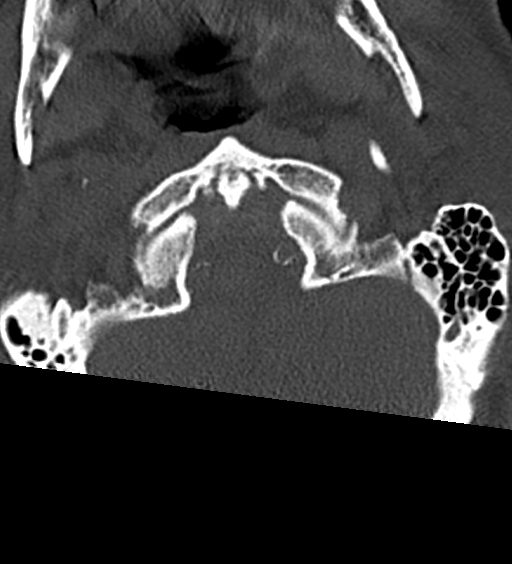

[13 of 33 positions shown; findings below may reference images not displayed]

FINDINGS: CT HEAD FINDINGS

Brain: Stable age related cerebral atrophy, ventriculomegaly and
periventricular white matter disease. No extra-axial fluid
collections are identified. No CT findings for acute hemispheric
infarction or intracranial hemorrhage. No mass lesions. The
brainstem and cerebellum are normal.

Vascular: Advanced atherosclerotic calcifications but no hyperdense
vessel or unexpected calcification.

Skull: No acute skull fracture.  No bone lesions.

Sinuses/Orbits: The paranasal sinuses and mastoid air cells are
clear. The globes are intact. Scleral band noted on the right side.

Other: No scalp lesions or hematoma.

CT CERVICAL SPINE FINDINGS

Alignment: Normal overall alignment.

Skull base and vertebrae: Advanced degenerative cervical spondylosis
with multilevel disc disease and severe multilevel facet disease. No
acute cervical spine fracture. The skull base C1 and C1-2
articulations are maintained. Advanced C1-2 degenerative changes.

Soft tissues and spinal canal: No prevertebral fluid or swelling. No
visible canal hematoma.

Disc levels: No acute disc protrusion. Multilevel disc disease and
facet disease with multilevel foraminal stenosis.

Upper chest: The lung apices are clear.

Other: No neck mass or adenopathy.
IMPRESSION: 1. No acute intracranial findings or skull fracture.
2. Stable age related cerebral atrophy, ventriculomegaly and
periventricular white matter disease.
3. Advanced degenerative cervical spondylosis with multilevel disc
disease and facet disease but no acute cervical spine fracture.
